# Patient Record
Sex: Female | Born: 1975
Health system: Southern US, Community
[De-identification: ages and names within clinical notes are randomized; demographics above are authoritative.]

## PROBLEM LIST (undated history)

## (undated) DIAGNOSIS — E039 Hypothyroidism, unspecified: Secondary | ICD-10-CM

## (undated) HISTORY — PX: PLACEMENT OF BREAST IMPLANTS: SHX6334

## (undated) HISTORY — DX: Hypothyroidism, unspecified: E03.9

## (undated) HISTORY — PX: OTHER SURGICAL HISTORY: SHX169

---

## 2015-02-28 ENCOUNTER — Other Ambulatory Visit: Payer: Self-pay | Admitting: Internal Medicine

## 2015-02-28 DIAGNOSIS — N63 Unspecified lump in unspecified breast: Secondary | ICD-10-CM

## 2015-03-02 ENCOUNTER — Ambulatory Visit
Admission: RE | Admit: 2015-03-02 | Discharge: 2015-03-02 | Disposition: A | Discharge status: Home / Self Care | Payer: BLUE CROSS/BLUE SHIELD | Source: Ambulatory Visit | Attending: Internal Medicine | Admitting: Internal Medicine

## 2015-03-02 DIAGNOSIS — N63 Unspecified lump in unspecified breast: Secondary | ICD-10-CM

## 2016-08-01 HISTORY — PX: AUGMENTATION MAMMAPLASTY: SUR837

## 2018-06-02 DIAGNOSIS — L509 Urticaria, unspecified: Secondary | ICD-10-CM | POA: Diagnosis not present

## 2018-06-22 DIAGNOSIS — E782 Mixed hyperlipidemia: Secondary | ICD-10-CM | POA: Diagnosis not present

## 2018-06-22 DIAGNOSIS — Z0001 Encounter for general adult medical examination with abnormal findings: Secondary | ICD-10-CM | POA: Diagnosis not present

## 2018-06-22 DIAGNOSIS — L509 Urticaria, unspecified: Secondary | ICD-10-CM | POA: Diagnosis not present

## 2018-07-15 DIAGNOSIS — E782 Mixed hyperlipidemia: Secondary | ICD-10-CM | POA: Diagnosis not present

## 2018-07-15 DIAGNOSIS — Z0001 Encounter for general adult medical examination with abnormal findings: Secondary | ICD-10-CM | POA: Diagnosis not present

## 2018-07-15 DIAGNOSIS — N939 Abnormal uterine and vaginal bleeding, unspecified: Secondary | ICD-10-CM | POA: Diagnosis not present

## 2018-07-15 DIAGNOSIS — L509 Urticaria, unspecified: Secondary | ICD-10-CM | POA: Diagnosis not present

## 2018-07-15 DIAGNOSIS — Z124 Encounter for screening for malignant neoplasm of cervix: Secondary | ICD-10-CM | POA: Diagnosis not present

## 2018-07-16 ENCOUNTER — Other Ambulatory Visit: Payer: Self-pay | Admitting: Internal Medicine

## 2018-07-16 DIAGNOSIS — Z1231 Encounter for screening mammogram for malignant neoplasm of breast: Secondary | ICD-10-CM

## 2018-07-30 ENCOUNTER — Ambulatory Visit: Payer: BLUE CROSS/BLUE SHIELD | Admitting: Women's Health

## 2018-07-30 ENCOUNTER — Encounter: Payer: Self-pay | Admitting: Women's Health

## 2018-07-30 VITALS — BP 100/70 | HR 81 | Ht 62.0 in | Wt 137.4 lb

## 2018-07-30 DIAGNOSIS — N920 Excessive and frequent menstruation with regular cycle: Secondary | ICD-10-CM | POA: Diagnosis not present

## 2018-07-30 LAB — POCT WET PREP (WET MOUNT)
CLUE CELLS WET PREP WHIFF POC: NEGATIVE
Trichomonas Wet Prep HPF POC: ABSENT

## 2018-07-30 NOTE — Progress Notes (Addendum)
   GYN VISIT Patient name: Madison Griffin MRN [id number]  Date of birth: [birth year] Chief Complaint:   new gyn (abnormal uterine bleeding / pap done 07-15-18)  History of Present Illness:   Madison Griffin is a 42 y.o. G0 Caucasian female being seen today for as a new pt for report of heavy painful periods. Periods have been heavy since menarche @ 42yo, however have worsened in last 1.6632yrs. Has 1 to 2 periods q month, last 10-15d, changes super tampon and pad q 1-2hrs at heaviest, painful cramps, clots as large as oranges, soils clothes often. Had normal pelvic u/s in 2012. Does not want children. Normal pap 07/15/18 w/ PCP.     Patient's last menstrual period was 07/26/2018.  Last pap 07/15/2018 w/ PCP. Results were:  normal w/ -HRHPV Review of Systems:   Pertinent items are noted in HPI Denies fever/chills, dizziness, headaches, visual disturbances, fatigue, shortness of breath, chest pain, abdominal pain, vomiting, abnormal vaginal discharge/itching/odor/irritation, problems with periods, bowel movements, urination, or intercourse unless otherwise stated above.  Pertinent History Reviewed:  Reviewed past medical,surgical, social, obstetrical and family history.  Reviewed problem list, medications and allergies. Physical Assessment:   Vitals:   07/30/18 1505  BP: 100/70  Pulse: 81  Weight: 137 lb 6.4 oz (62.3 kg)  Height: 5\' 2"  (1.575 m)  Body mass index is 25.13 kg/m.       Physical Examination:   General appearance: alert, well appearing, and in no distress  Mental status: alert, oriented to person, place, and time  Skin: warm & dry   Cardiovascular: normal heart rate noted  Respiratory: normal respiratory effort, no distress  Abdomen: soft, non-tender   Pelvic: VULVA: normal appearing vulva with no masses, tenderness or lesions, VAGINA: normal appearing vagina with normal color and discharge, no lesions, CERVIX: normal appearing cervix without discharge or lesions, very tender UTERUS:  uterus is normal size, shape, consistency, very tender, ADNEXA: normal adnexa in size, nontender and no masses, very tender bilaterally  Extremities: no edema   Results for orders placed or performed in visit on 07/30/18 (from the past 24 hour(s))  POCT Wet Prep Mellody Drown([place] [place])   Collection Time: 07/30/18  4:12 PM  Result Value Ref Range   Source Wet Prep POC vaginal    WBC, Wet Prep HPF POC none    Bacteria Wet Prep HPF POC Few Few   BACTERIA WET PREP MORPHOLOGY POC     Clue Cells Wet Prep HPF POC None None   Clue Cells Wet Prep Whiff POC Negative Whiff    Yeast Wet Prep HPF POC None None   KOH Wet Prep POC     Trichomonas Wet Prep HPF POC Absent Absent    Assessment & Plan:  1) Menorrhagia w/ regular cycles> get pelvic u/s, discussed birth control vs ablation- pt prefers ablation- has done birth control in past and did not like it  Meds: No orders of the defined types were placed in this encounter.   Orders Placed This Encounter  Procedures  . US PELVIS (TRANSABDOMINAL ONLY)  . US PELVIS TRANSVANGINAL NON-OB (TV ONLY)  . POCT Wet Prep [facility]([place] [place])    Return for next Friday for pelvic u/s and f/u w/ JVF after.  Cheral MarkerKimberly R Tiarra Anastacio CNM, [facility]NP-BC 07/30/2018 4:12 PM

## 2018-07-30 NOTE — Addendum Note (Signed)
Addended by: Cheral MarkerBOOKER, Vy Badley R on: 07/30/2018 04:12 PM   Modules accepted: Orders

## 2018-08-07 ENCOUNTER — Ambulatory Visit (INDEPENDENT_AMBULATORY_CARE_PROVIDER_SITE_OTHER): Payer: BLUE CROSS/BLUE SHIELD

## 2018-08-07 ENCOUNTER — Ambulatory Visit: Payer: BLUE CROSS/BLUE SHIELD | Admitting: Obstetrics and Gynecology

## 2018-08-07 DIAGNOSIS — N920 Excessive and frequent menstruation with regular cycle: Secondary | ICD-10-CM

## 2018-08-07 NOTE — Progress Notes (Signed)
PELVIC US TA/TV: anteverted uterus w/focal anterior heterogeneity 2.2 x 1.6 x 1.7 cm (? Adenomyosis),simple left ovarian cyst 2.5 x 2.2 x 1.5 cm,normal right ovary,no free fluid,no pain during ultrasound,ovaries appear mobile

## 2018-08-20 ENCOUNTER — Ambulatory Visit: Payer: BLUE CROSS/BLUE SHIELD | Admitting: Obstetrics and Gynecology

## 2018-08-20 ENCOUNTER — Ambulatory Visit (HOSPITAL_COMMUNITY)
Admission: RE | Admit: 2018-08-20 | Discharge: 2018-08-20 | Disposition: A | Discharge status: Home / Self Care | Payer: BLUE CROSS/BLUE SHIELD | Source: Ambulatory Visit | Attending: Internal Medicine | Admitting: Internal Medicine

## 2018-08-20 ENCOUNTER — Encounter (HOSPITAL_COMMUNITY): Payer: Self-pay

## 2018-08-20 ENCOUNTER — Other Ambulatory Visit: Payer: Self-pay | Admitting: Internal Medicine

## 2018-08-20 ENCOUNTER — Encounter: Payer: Self-pay | Admitting: Obstetrics and Gynecology

## 2018-08-20 ENCOUNTER — Other Ambulatory Visit (HOSPITAL_COMMUNITY): Payer: Self-pay | Admitting: Internal Medicine

## 2018-08-20 VITALS — BP 93/66 | HR 86 | Ht 62.0 in | Wt 138.0 lb

## 2018-08-20 DIAGNOSIS — N8000 Endometriosis of the uterus, unspecified: Secondary | ICD-10-CM

## 2018-08-20 DIAGNOSIS — R928 Other abnormal and inconclusive findings on diagnostic imaging of breast: Secondary | ICD-10-CM

## 2018-08-20 DIAGNOSIS — Z1231 Encounter for screening mammogram for malignant neoplasm of breast: Secondary | ICD-10-CM

## 2018-08-20 NOTE — Progress Notes (Signed)
Patient ID: Madison Griffin, female   DOB: May 11, 1976, 42 y.o.   MRN: [id number]   [facility]  @[facility] @            Patient name: Madison Laaula Shorten MRN [id number]  Date of birth: May 11, 1976  CC & HPI:  Madison Laaula Barbone is a 42 y.o. female presenting today for a F/U after her US on 08/07/2018 for AUB/menorrhagia. Her periods have always been heavy but have worsened in the past 1.8010yrs. They last 10-15 days and during the first 3-4 days she changes her pad/tampon every hour. Pt experiences severe, stabbing pains during her periods and pain towards the entrance of her vagina during intercourse. Pain is worse in the morning and nighttime. She has no children by choice and used BCPs in the past. She is not taking BCPs now, and gets cramps even when she does not have her period. She is reluctant to start taking BCPs again because they made her nauseous. Her last pap was on 07/15/2018 and was normal. The patient denies fever, chills or any other symptoms or complaints at this time.   ROS:  ROS + AUB + menorrhagia + abdominal cramping with periods - fever - chills All systems are negative except as noted in the HPI and PMH.   Pertinent History Reviewed:   Reviewed:  Medical        History reviewed. No pertinent past medical history.                            Surgical Hx:    Past Surgical History:  Procedure Laterality Date  . AUGMENTATION MAMMAPLASTY Bilateral 08/01/2016  . breast inplant    . PLACEMENT OF BREAST IMPLANTS     Medications: Reviewed & Updated - see associated section                      No current outpatient medications on file.  Social History: Reviewed -  reports that she has never smoked. She has never used smokeless tobacco.  Objective Findings:  Vitals: Height 5\' 2"  (1.575 m), weight 138 lb (62.6 kg), last menstrual period 07/26/2018.  PHYSICAL EXAMINATION General appearance - alert, well appearing, and in no distress, oriented to person, place, and time and normal  appearing weight Mental status - alert, oriented to person, place, and time, normal mood, behavior, speech, dress, motor activity, and thought processes, affect appropriate to mood  PELVIC (exam performed at 2:15pm) VAGINA: walls nontender, long, well supported. Normal secretions CERVIX: deviated slightly to the right, firm BLADDER: contact caused slight pain/pressure symptoms UTERUS: anteflexed, apparent discomfort with contract of uterine body  Assessment & Plan:   A:  1. Probable adenomyosis -- US reviewed with pt 2. Dyspareunia 3. Dysmenorrhea 4. Returned at 2:15pm today for pelvic exam  P:  1.   F/U in 2 weeks after discussing laparoscopic supracervical hysterectomy with husband  By signing my name below, I, Pietro CassisEmily Tufford, attest that this documentation has been prepared under the direction and in the presence of Tilda BurrowFerguson, Clifford Coudriet V, MD. Electronically Signed: Pietro CassisEmily Tufford, Medical Scribe. 08/20/18. 11:52 AM.  I personally performed the services described in this documentation, which was SCRIBED in my presence. The recorded information has been reviewed and considered accurate. It has been edited as necessary during review. Tilda BurrowJohn V Ka Flammer, MD

## 2018-08-21 ENCOUNTER — Telehealth: Payer: Self-pay | Admitting: *Deleted

## 2018-08-21 NOTE — Telephone Encounter (Signed)
    Normal pap with negative HPV, repeat in 3 yr    Pt notified.

## 2018-09-01 ENCOUNTER — Ambulatory Visit (HOSPITAL_COMMUNITY)
Admission: RE | Admit: 2018-09-01 | Discharge: 2018-09-01 | Disposition: A | Discharge status: Home / Self Care | Payer: BLUE CROSS/BLUE SHIELD | Source: Ambulatory Visit | Attending: Internal Medicine | Admitting: Internal Medicine

## 2018-09-01 ENCOUNTER — Ambulatory Visit (HOSPITAL_COMMUNITY): Admission: RE | Admit: 2018-09-01 | Payer: BLUE CROSS/BLUE SHIELD | Source: Ambulatory Visit

## 2018-09-01 DIAGNOSIS — R928 Other abnormal and inconclusive findings on diagnostic imaging of breast: Secondary | ICD-10-CM | POA: Diagnosis not present

## 2018-09-01 DIAGNOSIS — R922 Inconclusive mammogram: Secondary | ICD-10-CM | POA: Diagnosis not present

## 2018-09-01 NOTE — Progress Notes (Signed)
Progress Notes by Tilda Burrow, MD at 08/20/2018 12:00 PM  Author: Tilda Burrow, MD Author Type: Physician Filed: 08/23/2018 8:32 PM  Note Status: Signed Cosign: Cosign Not Required Encounter Date: 08/20/2018  Editor: Tilda Burrow, MD (Physician)  Prior Versions: 1. Jerilee Field at 08/20/2018 2:42 PM - Shared   2. Jerilee Field at 08/20/2018 12:50 PM - Shared    Patient ID: Madison Griffin, female   DOB: [birth year], 42 y.o.   MRN: [id number]   [facility] Clinic Visit  @DATE @            Patient name: Madison Griffin      MRN [id number]  Date of birth: 08/07/1976  CC & HPI:  Madison Griffin is a 42 y.o. female presenting today for a F/U after her [place] on 08/07/2018 for AUB/menorrhagia. Her periods have always been heavy but have worsened in the past 1.56yrs. They last 10-15 days and during the first 3-4 days she changes her pad/tampon every hour. Pt experiences severe, stabbing pains during her periods and pain towards the entrance of her vagina during intercourse. Pain is worse in the morning and nighttime. She has no children by choice and used BCPs in the past. She is not taking BCPs now, and gets cramps even when she does not have her period. She is reluctant to start taking BCPs again because they made her nauseous. Her last pap was on 07/15/2018 and was normal. The patient denies fever, chills or any other symptoms or complaints at this time.   ROS:  ROS + AUB + menorrhagia + abdominal cramping with periods - fever - chills All systems are negative except as noted in the HPI and PMH.   Pertinent History Reviewed:   Reviewed:  Medical        History reviewed. No pertinent past medical history.                            Surgical Hx:         Past Surgical History:  Procedure Laterality Date  . AUGMENTATION MAMMAPLASTY Bilateral 08/01/2016  . breast inplant    . PLACEMENT OF BREAST IMPLANTS     Medications: Reviewed & Updated - see associated section          No current outpatient medications on file.  Social History: Reviewed -  reports that she has never smoked. She has never used smokeless tobacco.  Objective Findings:  Vitals: Height 5\' 2"  (1.575 m), weight 138 lb (62.6 kg), last menstrual period 07/26/2018.  PHYSICAL EXAMINATION General appearance - alert, well appearing, and in no distress, oriented to person, place, and time and normal appearing weight Mental status - alert, oriented to person, place, and time, normal mood, behavior, speech, dress, motor activity, and thought processes, affect appropriate to mood  PELVIC (exam performed at 2:15pm) VAGINA: walls nontender, long, well supported. Normal secretions CERVIX: deviated slightly to the right, firm BLADDER: contact caused slight pain/pressure symptoms UTERUS: anteflexed, apparent discomfort with contract of uterine body GYNECOLOGIC SONOGRAM   Madison Griffin . LMP 07/26/2018 she is here for a pelvic sonogram for menorrhagia.  Uterus                      7.5 x 4.3 x 5.3 cm, vol 89 ml, anteverted uterus w/focal anterior heterogeneity 2.2 x 1.6 x 1.7 cm (? Adenomyosis)  Endometrium  7.1 mm, symmetrical, wnl  Right ovary             2.3 x 1.1 x 1.8 cm, wnl  Left ovary                8.9 x 2.4 x 2.3 cm, simple left ovarian cyst 2.5 x 2.2 x 1.5 cm  No free fluid   Technician Comments:  PELVIC US TA/TV: anteverted uterus w/focal anterior heterogeneity 2.2 x 1.6 x 1.7 cm (? Adenomyosis),simple left ovarian cyst 2.5 x 2.2 x 1.5 cm,normal right ovary,EEC 7.1 mm,no free fluid,no pain during ultrasound,ovaries appear mobile     E. I. du Pontmber J Carl 08/07/2018 3:03 PM  Clinical Impression and recommendations:  I have reviewed the sonogram results above.  Combined with the patient's current clinical course, below are my impressions and any appropriate recommendations for management based on the sonographic findings:  1.  Heterogenous myometrium with  fluid pockets in anterior myometrium most c/w adenomyosis. Normal ovaries, and cervix Tilda BurrowJohn V Jemmie Ledgerwood  Assessment & Plan:   A:  1. Probable adenomyosis -- US reviewed with pt 2. Dyspareunia 3. Dysmenorrhea 4. Returned at 2:15pm today for pelvic exam  P:  1.   F/U in 2 weeks after discussing laparoscopic supracervical hysterectomy with husband  By signing my name below, I, Pietro CassisEmily Tufford, attest that this documentation has been prepared under the direction and in the presence of Tilda BurrowFerguson, Raju Coppolino V, MD. Electronically Signed: Pietro CassisEmily Tufford, Medical Scribe. 08/20/18. 11:52 AM.  I personally performed the services described in this documentation, which was SCRIBED in my presence. The recorded information has been reviewed and considered accurate. It has been edited as necessary during review. Tilda BurrowJohn V Levora Werden, MD      Instructions      Return in about 2 weeks (around 09/03/2018) for GYN: FU.  Communications      [facility] Provider CC Chart Rep sent to Benita StabileJohn Z Hall, MD  Sent 08/23/2018

## 2018-09-02 ENCOUNTER — Ambulatory Visit: Payer: BLUE CROSS/BLUE SHIELD | Admitting: Obstetrics and Gynecology

## 2018-09-02 ENCOUNTER — Encounter: Payer: Self-pay | Admitting: Obstetrics and Gynecology

## 2018-09-02 VITALS — BP 122/73 | HR 80 | Ht 62.0 in | Wt 138.4 lb

## 2018-09-02 DIAGNOSIS — N941 Unspecified dyspareunia: Secondary | ICD-10-CM | POA: Diagnosis not present

## 2018-09-02 DIAGNOSIS — N8000 Endometriosis of the uterus, unspecified: Secondary | ICD-10-CM

## 2018-09-02 NOTE — Progress Notes (Signed)
Patient ID: Madison Griffin, female   DOB: 05-14-1976, 42 y.o.   MRN: [id number]    [facility] Clinic Visit  @DATE @            Patient name: Madison Griffin MRN [id number]  Date of birth: [birth year]  CC & HPI:  Madison Griffin is a 42 y.o. female presenting today for f/u visit from 08/20/18, with her husband to discuss  Treat her suspected adenomyosis. There was painful sexual intercourse and discussed laparoscopic hysterectomy leaving the ovaries  ROS:  ROS -fever -chills All systems are negative except as noted in the HPI and PMH.   Pertinent History Reviewed:   Reviewed: Medical        No past medical history on file.                            Surgical Hx:    Past Surgical History:  Procedure Laterality Date  . AUGMENTATION MAMMAPLASTY Bilateral 08/01/2016  . breast inplant    . PLACEMENT OF BREAST IMPLANTS     Medications: Reviewed & Updated - see associated section                      No current outpatient medications on file.   Social History: Reviewed -  reports that she has never smoked. She has never used smokeless tobacco.  Objective Findings:  Vitals: Blood pressure 122/73, pulse 80, height 5\' 2"  (1.575 m), weight 138 lb 6.4 oz (62.8 kg), last menstrual period 08/22/2018.  PHYSICAL EXAMINATION General appearance - alert, well appearing, and in no distress, oriented to person, place, and time and normal appearing weight Mental status - alert, oriented to person, place, and time, normal mood, behavior, speech, dress, motor activity, and thought processes, affect appropriate to mood  PELVIC NOT DONE-DISCUSSION ONLY  Assessment & Plan:   A:  1.  adenomyosis with dyspareunia    P:  1.  Mid January for laparoscopic hysterectomy 2. F/u 3 months for pre-op   Discussion: 1. Discussed with pt risks and benefits of laparoscopic hysterectomy  At end of discussion, pt had opportunity to ask questions and has no further questions at this time.   Specific discussion  of laparoscopic hysterectomy as noted above. Greater than 50% was spent in counseling and coordination of care with the patient.   Total time greater than: 15 minutes.    By signing my name below, I, Arnette Norris, attest that this documentation has been prepared under the direction and in the presence of Tilda Burrow, MD. Electronically Signed: Arnette Norris Medical Scribe. 09/02/18. 3:42 PM.  I personally performed the services described in this documentation, which was SCRIBED in my presence. The recorded information has been reviewed and considered accurate. It has been edited as necessary during review. Tilda Burrow, MD

## 2018-11-16 ENCOUNTER — Ambulatory Visit: Payer: BLUE CROSS/BLUE SHIELD | Admitting: Obstetrics and Gynecology

## 2018-11-16 ENCOUNTER — Encounter: Payer: Self-pay | Admitting: Obstetrics and Gynecology

## 2018-11-16 VITALS — BP 119/77 | HR 69 | Ht 64.0 in | Wt 137.6 lb

## 2018-11-16 DIAGNOSIS — N800 Endometriosis of uterus: Secondary | ICD-10-CM

## 2018-11-16 DIAGNOSIS — N941 Unspecified dyspareunia: Secondary | ICD-10-CM | POA: Diagnosis not present

## 2018-11-16 DIAGNOSIS — N809 Endometriosis, unspecified: Secondary | ICD-10-CM | POA: Diagnosis not present

## 2018-11-16 DIAGNOSIS — N8003 Adenomyosis of the uterus: Secondary | ICD-10-CM

## 2018-11-16 NOTE — Progress Notes (Addendum)
Patient ID: Madison Griffin, female   DOB: July 21, 1976, 42 y.o.   MRN: [id number]  Discussion: 1. Discussed with pt and husband risks and benefits of laparoscopic vaginally assisted hysterectomy.  Different types of hysterectomy with cervical preservation, laparoscopic supracervical hysterectomy discussed and patient prefers to have cervix removed.  Discussed restoration of the ovaries and patient desires ovarian preservation.  Discussed removal of fallopian tubes with its reduced lifetime cancer risk in this area of up to 50% and patient desires to have the tubes removed  Says that she feels pain as if something is being contacted during deep penetration inside and has residual pain next day. Has no children. Would like to have Surgery in late January, around January 28th. At end of discussion, pt had opportunity to ask questions and has no further questions at this time.   Specific discussion of  laparoscopic assisted vaginal hysterectomy with bilateral salpingectomy  as noted above. Greater than 50% was spent in counseling and coordination of care with the patient.  Patient has brochures about laparoscopic surgery already  Total time greater than: 20 minutes.    By signing my name below, I, Arnette NorrisMari Johnson, attest that this documentation has been prepared under the direction and in the presence of Tilda BurrowFerguson, Drew Lips V, MD. Electronically Signed: Arnette NorrisMari Johnson Medical Scribe. 11/16/18. 4:06 PM.  I personally performed the services described in this documentation, which was SCRIBED in my presence. The recorded information has been reviewed and considered accurate. It has been edited as necessary during review. Tilda BurrowJohn V Garet Hooton, MD

## 2018-12-07 ENCOUNTER — Ambulatory Visit: Payer: BLUE CROSS/BLUE SHIELD | Admitting: Obstetrics and Gynecology

## 2018-12-14 ENCOUNTER — Ambulatory Visit: Payer: BLUE CROSS/BLUE SHIELD | Admitting: Obstetrics and Gynecology

## 2018-12-14 ENCOUNTER — Encounter: Payer: Self-pay | Admitting: Obstetrics and Gynecology

## 2018-12-14 VITALS — BP 128/73 | HR 78 | Ht 62.0 in | Wt 140.6 lb

## 2018-12-14 DIAGNOSIS — Z01818 Encounter for other preprocedural examination: Secondary | ICD-10-CM | POA: Diagnosis not present

## 2018-12-14 NOTE — Progress Notes (Signed)
Patient ID: Madison Griffin, female   DOB: 08/28/1976, 43 y.o.   MRN: [id number]  Preoperative History and Physical  Madison Griffin is a 43 y.o. No obstetric history on file. here for surgical management of Adenomyosis with dyspareunia. No significant preoperative concerns.  Proposed surgery: laparoscopic assisted vaginal hysterectomy with bilateral salpingectomy   No past medical history on file. Past Surgical History:  Procedure Laterality Date  . AUGMENTATION MAMMAPLASTY Bilateral 08/01/2016  . breast inplant    . PLACEMENT OF BREAST IMPLANTS     OB History  No obstetric history on file.  Patient denies any other pertinent gynecologic issues.   No current outpatient medications on file prior to visit.   No current facility-administered medications on file prior to visit.    No Known Allergies  Social History:   reports that she has never smoked. She has never used smokeless tobacco. She reports current alcohol use. She reports that she does not use drugs.  Family History  Problem Relation Age of Onset  . Other Father        cardiac arrest  . Diabetes Mother     Review of Systems: Noncontributory  PHYSICAL EXAM: There were no vitals taken for this visit. General appearance - alert, well appearing, and in no distress Chest - clear to auscultation, no wheezes, rales or rhonchi, symmetric air entry Heart - normal rate and regular rhythm Abdomen - soft, nontender, nondistended, no masses or organomegaly Pelvic -  VAGINA: good vaginal support, pain when palpating vaginal side wall, moderate tenderness over bladder. CERVIX: not visualized due to discomfort of exam UTERUS: tenderness when examining with speculum and finger exam. Extremities - peripheral pulses normal, no pedal edema, no clubbing or cyanosis  Labs: No results found for this or any previous visit (from the past 336 hour(s)).  Imaging Studies: No results found.  Assessment: Patient Active Problem List    Diagnosis Date Noted  . Menorrhagia with regular cycle 07/30/2018    Plan: Patient will undergo surgical management with laparoscopic assisted vaginal hysterectomy with bilateral salpingectomy on 01/12/2019   F/u 2 week Post-op visit around 01/26/2019   By signing my name below, I, Arnette Norris, attest that this documentation has been prepared under the direction and in the presence of Tilda Burrow, MD. Electronically Signed: Arnette Norris Medical Scribe. 12/14/18. 2:50 PM.  I personally performed the services described in this documentation, which was SCRIBED in my presence. The recorded information has been reviewed and considered accurate. It has been edited as necessary during review. Tilda Burrow, MD

## 2018-12-16 LAB — GC/CHLAMYDIA PROBE AMP
Chlamydia trachomatis, NAA: NEGATIVE
NEISSERIA GONORRHOEAE BY PCR: NEGATIVE

## 2019-01-05 NOTE — Patient Instructions (Addendum)
Your procedure is scheduled on: 01/12/2019  Report to [facility] at   6:15  AM.  Call this number if you have problems the morning of surgery: [phone]   Remember:   Do not drink or eat food:After Midnight.  :  Take these medicines the morning of surgery with A SIP OF WATER:    Do not wear jewelry, make-up or nail polish.  Do not wear lotions, powders, or perfumes. You may wear deodorant.  Do not shave 48 hours prior to surgery. Men may shave face and neck.  Do not bring valuables to the hospital.  Contacts, dentures or bridgework may not be worn into surgery.  Leave suitcase in the car. After surgery it may be brought to your room.  For patients admitted to the hospital, checkout time is 11:00 AM the day of discharge.   Patients discharged the day of surgery will not be allowed to drive home.    Special Instructions: Shower using CHG night before surgery and shower the day of surgery use CHG.  Use special wash - you have one bottle of CHG for all showers.  You should use approximately 1/2 of the bottle for each shower.  Laparoscopically Assisted Vaginal Hysterectomy, Care After This sheet gives you information about how to care for yourself after your procedure. Your health care provider may also give you more specific instructions. If you have problems or questions, contact your health care provider. What can I expect after the procedure? After the procedure, it is common to have:  Soreness and numbness in your incision areas.  Abdominal pain. You will be given pain medicine to control it.  Vaginal bleeding and discharge. You will need to use a sanitary napkin after this procedure.  Sore throat from the breathing tube that was inserted during surgery. Follow these instructions at home: Medicines  Take over-the-counter and prescription medicines only as told by your health care provider.  Do not take aspirin or ibuprofen. These medicines can cause bleeding.  Do not  drive or use heavy machinery while taking prescription pain medicine.  Do not drive for 24 hours if you were given a medicine to help you relax (sedative) during the procedure. Incision care   Follow instructions from your health care provider about how to take care of your incisions. Make sure you: ? Wash your hands with soap and water before you change your bandage (dressing). If soap and water are not available, use hand sanitizer. ? Change your dressing as told by your health care provider. ? Leave stitches (sutures), skin glue, or adhesive strips in place. These skin closures may need to stay in place for 2 weeks or longer. If adhesive strip edges start to loosen and curl up, you may trim the loose edges. Do not remove adhesive strips completely unless your health care provider tells you to do that.  Check your incision area every day for signs of infection. Check for: ? Redness, swelling, or pain. ? Fluid or blood. ? Warmth. ? Pus or a bad smell. Activity  Get regular exercise as told by your health care provider. You may be told to take short walks every day and go farther each time.  Return to your normal activities as told by your health care provider. Ask your health care provider what activities are safe for you.  Do not douche, use tampons, or have sexual intercourse for at least 6 weeks, or until your health care provider gives you permission.  Do  not lift anything that is heavier than 10 lb (4.5 kg), or the limit that your health care provider tells you, until he or she says that it is safe. General instructions  Do not take baths, swim, or use a hot tub until your health care provider approves. Take showers instead of baths.  Do not drive for 24 hours if you received a sedative.  Do not drive or operate heavy machinery while taking prescription pain medicine.  To prevent or treat constipation while you are taking prescription pain medicine, your health care provider  may recommend that you: ? Drink enough fluid to keep your urine clear or pale yellow. ? Take over-the-counter or prescription medicines. ? Eat foods that are high in fiber, such as fresh fruits and vegetables, whole grains, and beans. ? Limit foods that are high in fat and processed sugars, such as fried and sweet foods.  Keep all follow-up visits as told by your health care provider. This is important. Contact a health care provider if:  You have signs of infection, such as: ? Redness, swelling, or pain around your incision sites. ? Fluid or blood coming from an incision. ? An incision that feels warm to the touch. ? Pus or a bad smell coming from an incision.  Your incision breaks open.  Your pain medicine is not helping.  You feel dizzy or light-headed.  You have pain or bleeding when you urinate.  You have persistent nausea and vomiting.  You have blood, pus, or a bad-smelling discharge from your vagina. Get help right away if:  You have a fever.  You have severe abdominal pain.  You have chest pain.  You have shortness of breath.  You faint.  You have pain, swelling, or redness in your leg.  You have heavy bleeding from your vagina. Summary  After the procedure, it is common to have abdominal pain and vaginal bleeding.  You should not drive or lift heavy objects until your health care provider says that it is safe.  Contact your health care provider if you have any symptoms of infection, excessive vaginal bleeding, nausea, vomiting, or shortness of breath. This information is not intended to replace advice given to you by your health care provider. Make sure you discuss any questions you have with your health care provider. Document Released: 11/14/2011 Document Revised: 01/21/2017 Document Reviewed: 01/21/2017 Elsevier Interactive Patient Education  2019 Elsevier Inc.  General Anesthesia, Adult, Care After This sheet gives you information about how to care  for yourself after your procedure. Your health care provider may also give you more specific instructions. If you have problems or questions, contact your health care provider. What can I expect after the procedure? After the procedure, the following side effects are common:  Pain or discomfort at the IV site.  Nausea.  Vomiting.  Sore throat.  Trouble concentrating.  Feeling cold or chills.  Weak or tired.  Sleepiness and fatigue.  Soreness and body aches. These side effects can affect parts of the body that were not involved in surgery. Follow these instructions at home:  For at least 24 hours after the procedure:  Have a responsible adult stay with you. It is important to have someone help care for you until you are awake and alert.  Rest as needed.  Do not: ? Participate in activities in which you could fall or become injured. ? Drive. ? Use heavy machinery. ? Drink alcohol. ? Take sleeping pills or medicines that cause drowsiness. ?  Make important decisions or sign legal documents. ? Take care of children on your own. Eating and drinking  Follow any instructions from your health care provider about eating or drinking restrictions.  When you feel hungry, start by eating small amounts of foods that are soft and easy to digest (bland), such as toast. Gradually return to your regular diet.  Drink enough fluid to keep your urine pale yellow.  If you vomit, rehydrate by drinking water, juice, or clear broth. General instructions  If you have sleep apnea, surgery and certain medicines can increase your risk for breathing problems. Follow instructions from your health care provider about wearing your sleep device: ? Anytime you are sleeping, including during daytime naps. ? While taking prescription pain medicines, sleeping medicines, or medicines that make you drowsy.  Return to your normal activities as told by your health care provider. Ask your health care  provider what activities are safe for you.  Take over-the-counter and prescription medicines only as told by your health care provider.  If you smoke, do not smoke without supervision.  Keep all follow-up visits as told by your health care provider. This is important. Contact a health care provider if:  You have nausea or vomiting that does not get better with medicine.  You cannot eat or drink without vomiting.  You have pain that does not get better with medicine.  You are unable to pass urine.  You develop a skin rash.  You have a fever.  You have redness around your IV site that gets worse. Get help right away if:  You have difficulty breathing.  You have chest pain.  You have blood in your urine or stool, or you vomit blood. Summary  After the procedure, it is common to have a sore throat or nausea. It is also common to feel tired.  Have a responsible adult stay with you for the first 24 hours after general anesthesia. It is important to have someone help care for you until you are awake and alert.  When you feel hungry, start by eating small amounts of foods that are soft and easy to digest (bland), such as toast. Gradually return to your regular diet.  Drink enough fluid to keep your urine pale yellow.  Return to your normal activities as told by your health care provider. Ask your health care provider what activities are safe for you. This information is not intended to replace advice given to you by your health care provider. Make sure you discuss any questions you have with your health care provider. Document Released: 03/03/2001 Document Revised: 07/11/2017 Document Reviewed: 07/11/2017 Elsevier Interactive Patient Education  [employer]2019 [employer]   Your procedure is scheduled on:   [facility].  Call this number if you have problems the morning of surgery: [phone]   Remember:   Do not drink or eat food:After Midnight.  :  Take  these medicines the morning of surgery with A SIP OF WATER:    Do not wear jewelry, make-up or nail polish.  Do not wear lotions, powders, or perfumes. You may wear deodorant.  Do not shave 48 hours prior to surgery. Men may shave face and neck.  Do not bring valuables to the hospital.  Contacts, dentures or bridgework may not be worn into surgery.  Leave suitcase in the car. After surgery it may be brought to your room.  For patients admitted to the hospital, checkout time is  11:00 AM the day of discharge.   Patients discharged the day of surgery will not be allowed to drive home.    Special Instructions: Shower using CHG night before surgery and shower the day of surgery use CHG.  Use special wash - you have one bottle of CHG for all showers.  You should use approximately 1/2 of the bottle for each shower.

## 2019-01-05 NOTE — Patient Instructions (Signed)
   Your procedure is scheduled on: 01/12/2019  Report to [facility] at  6:15   AM.  Call this number if you have problems the morning of surgery: [phone]   Remember:   Do not drink or eat food:After Midnight.  :  Take these medicines the morning of surgery with A SIP OF WATER:    Do not wear jewelry, make-up or nail polish.  Do not wear lotions, powders, or perfumes. You may wear deodorant.  Do not shave 48 hours prior to surgery. Men may shave face and neck.  Do not bring valuables to the hospital.  Contacts, dentures or bridgework may not be worn into surgery.  Leave suitcase in the car. After surgery it may be brought to your room.  For patients admitted to the hospital, checkout time is 11:00 AM the day of discharge.   Patients discharged the day of surgery will not be allowed to drive home.    Special Instructions: Shower using CHG night before surgery and shower the day of surgery use CHG.  Use special wash - you have one bottle of CHG for all showers.  You should use approximately 1/2 of the bottle for each shower.

## 2019-01-07 ENCOUNTER — Encounter (HOSPITAL_COMMUNITY): Payer: Self-pay

## 2019-01-07 ENCOUNTER — Encounter (HOSPITAL_COMMUNITY)
Admission: RE | Admit: 2019-01-07 | Discharge: 2019-01-07 | Disposition: A | Discharge status: Home / Self Care | Payer: BLUE CROSS/BLUE SHIELD | Source: Ambulatory Visit | Attending: Obstetrics and Gynecology | Admitting: Obstetrics and Gynecology

## 2019-01-07 ENCOUNTER — Other Ambulatory Visit: Payer: Self-pay

## 2019-01-07 DIAGNOSIS — N941 Unspecified dyspareunia: Secondary | ICD-10-CM | POA: Insufficient documentation

## 2019-01-07 DIAGNOSIS — Z01812 Encounter for preprocedural laboratory examination: Secondary | ICD-10-CM | POA: Insufficient documentation

## 2019-01-07 DIAGNOSIS — N854 Malposition of uterus: Secondary | ICD-10-CM | POA: Diagnosis not present

## 2019-01-07 LAB — COMPREHENSIVE METABOLIC PANEL
ALT: 19 U/L (ref 0–44)
AST: 21 U/L (ref 15–41)
Albumin: 4.2 g/dL (ref 3.5–5.0)
Alkaline Phosphatase: 50 U/L (ref 38–126)
Anion gap: 8 (ref 5–15)
BUN: 8 mg/dL (ref 6–20)
CHLORIDE: 104 mmol/L (ref 98–111)
CO2: 25 mmol/L (ref 22–32)
Calcium: 8.9 mg/dL (ref 8.9–10.3)
Creatinine, Ser: 0.7 mg/dL (ref 0.44–1.00)
GFR calc Af Amer: >60 mL/min (ref >60)
Glucose, Bld: 96 mg/dL (ref 70–99)
Potassium: 4.1 mmol/L (ref 3.5–5.1)
Sodium: 137 mmol/L (ref 135–145)
Total Bilirubin: 0.7 mg/dL (ref 0.3–1.2)
Total Protein: 7.6 g/dL (ref 6.5–8.1)

## 2019-01-07 LAB — TYPE AND SCREEN
ABO/RH(D): A POS
Antibody Screen: NEGATIVE

## 2019-01-07 LAB — URINALYSIS, ROUTINE W REFLEX MICROSCOPIC
Bilirubin Urine: NEGATIVE
GLUCOSE, UA: NEGATIVE mg/dL
Ketones, ur: NEGATIVE mg/dL
Nitrite: NEGATIVE
PROTEIN: NEGATIVE mg/dL
Specific Gravity, Urine: 1.009 (ref 1.005–1.030)
pH: 7 (ref 5.0–8.0)

## 2019-01-07 LAB — CBC
HCT: 36.1 % (ref 36.0–46.0)
Hemoglobin: 11.6 g/dL — ABNORMAL LOW (ref 12.0–15.0)
MCH: 29.5 pg (ref 26.0–34.0)
MCHC: 32.1 g/dL (ref 30.0–36.0)
MCV: 91.9 fL (ref 80.0–100.0)
PLATELETS: 313 10*3/uL (ref 150–400)
RBC: 3.93 MIL/uL (ref 3.87–5.11)
RDW: 13.3 % (ref 11.5–15.5)
WBC: 8.4 10*3/uL (ref 4.0–10.5)
nRBC: 0 % (ref 0.0–0.2)

## 2019-01-07 LAB — HCG, SERUM, QUALITATIVE: Preg, Serum: NEGATIVE

## 2019-01-08 ENCOUNTER — Encounter: Payer: Self-pay | Admitting: Obstetrics and Gynecology

## 2019-01-08 ENCOUNTER — Telehealth: Payer: Self-pay | Admitting: Obstetrics and Gynecology

## 2019-01-08 ENCOUNTER — Ambulatory Visit: Payer: BLUE CROSS/BLUE SHIELD | Admitting: Obstetrics and Gynecology

## 2019-01-08 VITALS — BP 111/68 | HR 76 | Ht 62.0 in | Wt 139.6 lb

## 2019-01-08 DIAGNOSIS — N800 Endometriosis of uterus: Secondary | ICD-10-CM

## 2019-01-08 DIAGNOSIS — N8003 Adenomyosis of the uterus: Secondary | ICD-10-CM

## 2019-01-08 DIAGNOSIS — N809 Endometriosis, unspecified: Secondary | ICD-10-CM

## 2019-01-08 DIAGNOSIS — Z01818 Encounter for other preprocedural examination: Secondary | ICD-10-CM | POA: Diagnosis not present

## 2019-01-08 NOTE — Progress Notes (Signed)
Patient ID: Madison Griffin, female   DOB: 21-Jan-1976, 43 y.o.   MRN: [id number]  I have evaluated [name] preoperatively, and have identified no interval change in the medical condition or plan of care since the history and physical of record. LMP 12/23/2018  Pelvic exam:  VAGINA: moderate to extreme discomfort with standard grade speculum, good bladder support vaginal sidewall discomfort upper limits normal CERVIX: normal appearing cervix without discharge or lesions, adequate uterine mobility from its anterior position that I think LAVH should be reasonable. UTERUS: anterior  A: 1. Adenomyosis  P: 1. LAVH 01/12/2019 with risks potential complications and alternatives reviewed with patient and partner  By signing my name below, I, Arnette Norris, attest that this documentation has been prepared under the direction and in the presence of Tilda Burrow, MD. Electronically Signed: Arnette Norris Medical Scribe. 01/08/19. 1:08 PM.  I personally performed the services described in this documentation, which was SCRIBED in my presence. The recorded information has been reviewed and considered accurate. It has been edited as necessary during review. Tilda Burrow, MD

## 2019-01-08 NOTE — Telephone Encounter (Signed)
Pt informed that surgery time moved to 10 am, due to other surgery that morning at [facility]. Pt to come in today at 12:30 for final pre OP exam. Labs Reviewed with the patient.

## 2019-01-12 ENCOUNTER — Other Ambulatory Visit: Payer: Self-pay

## 2019-01-12 ENCOUNTER — Observation Stay (HOSPITAL_COMMUNITY): Payer: BLUE CROSS/BLUE SHIELD | Admitting: Anesthesiology

## 2019-01-12 ENCOUNTER — Encounter (HOSPITAL_COMMUNITY)
Admission: RE | Disposition: A | Discharge status: Home / Self Care | Payer: Self-pay | Source: Home / Self Care | Attending: Obstetrics and Gynecology

## 2019-01-12 ENCOUNTER — Encounter (HOSPITAL_COMMUNITY): Payer: Self-pay | Admitting: *Deleted

## 2019-01-12 ENCOUNTER — Observation Stay (HOSPITAL_COMMUNITY)
Admission: RE | Admit: 2019-01-12 | Discharge: 2019-01-13 | Disposition: A | Discharge status: Home / Self Care | Payer: BLUE CROSS/BLUE SHIELD | Attending: Obstetrics and Gynecology | Admitting: Obstetrics and Gynecology

## 2019-01-12 DIAGNOSIS — N920 Excessive and frequent menstruation with regular cycle: Secondary | ICD-10-CM | POA: Diagnosis not present

## 2019-01-12 DIAGNOSIS — N800 Endometriosis of uterus: Principal | ICD-10-CM | POA: Insufficient documentation

## 2019-01-12 DIAGNOSIS — N888 Other specified noninflammatory disorders of cervix uteri: Secondary | ICD-10-CM | POA: Insufficient documentation

## 2019-01-12 DIAGNOSIS — N941 Unspecified dyspareunia: Secondary | ICD-10-CM | POA: Insufficient documentation

## 2019-01-12 DIAGNOSIS — N809 Endometriosis, unspecified: Secondary | ICD-10-CM | POA: Diagnosis not present

## 2019-01-12 DIAGNOSIS — Z90710 Acquired absence of both cervix and uterus: Secondary | ICD-10-CM | POA: Diagnosis present

## 2019-01-12 HISTORY — PX: LAPAROSCOPIC ASSISTED VAGINAL HYSTERECTOMY: SHX5398

## 2019-01-12 LAB — CREATININE, SERUM
Creatinine, Ser: 0.7 mg/dL (ref 0.44–1.00)
GFR calc non Af Amer: >60 mL/min (ref >60)

## 2019-01-12 LAB — CBC
HCT: 36.6 % (ref 36.0–46.0)
HEMOGLOBIN: 11.9 g/dL — AB (ref 12.0–15.0)
MCH: 30.4 pg (ref 26.0–34.0)
MCHC: 32.5 g/dL (ref 30.0–36.0)
MCV: 93.4 fL (ref 80.0–100.0)
Platelets: 295 10*3/uL (ref 150–400)
RBC: 3.92 MIL/uL (ref 3.87–5.11)
RDW: 13.2 % (ref 11.5–15.5)
WBC: 16.8 10*3/uL — ABNORMAL HIGH (ref 4.0–10.5)
nRBC: 0 % (ref 0.0–0.2)

## 2019-01-12 SURGERY — HYSTERECTOMY, VAGINAL, LAPAROSCOPY-ASSISTED
Anesthesia: General

## 2019-01-12 MED ORDER — HYDROMORPHONE HCL 1 MG/ML IJ SOLN
0.2500 mg | INTRAMUSCULAR | Status: DC | PRN
  Administered 2019-01-12: 0.5 mg via INTRAVENOUS
  Filled 2019-01-12: qty 0.5

## 2019-01-12 MED ORDER — FENTANYL CITRATE (PF) 100 MCG/2ML IJ SOLN
INTRAMUSCULAR | Status: AC
  Filled 2019-01-12: qty 4

## 2019-01-12 MED ORDER — LACTATED RINGERS IV SOLN
INTRAVENOUS | Status: DC | PRN
  Administered 2019-01-12 (×2): via INTRAVENOUS

## 2019-01-12 MED ORDER — ROCURONIUM BROMIDE 10 MG/ML (PF) SYRINGE
PREFILLED_SYRINGE | INTRAVENOUS | Status: AC
  Filled 2019-01-12: qty 10

## 2019-01-12 MED ORDER — BUPIVACAINE-EPINEPHRINE (PF) 0.25% -1:200000 IJ SOLN
INTRAMUSCULAR | Status: DC | PRN
  Administered 2019-01-12: 10 mL via PERINEURAL
  Administered 2019-01-12: 8 mL via PERINEURAL
  Administered 2019-01-12: 6 mL via PERINEURAL

## 2019-01-12 MED ORDER — MEPERIDINE HCL 50 MG/ML IJ SOLN: 6.2500 mg | INTRAMUSCULAR | Status: DC | PRN

## 2019-01-12 MED ORDER — DIPHENHYDRAMINE HCL 12.5 MG/5ML PO ELIX: 12.5000 mg | ORAL_SOLUTION | Freq: Four times a day (QID) | ORAL | Status: DC | PRN

## 2019-01-12 MED ORDER — DEXAMETHASONE SODIUM PHOSPHATE 10 MG/ML IJ SOLN
INTRAMUSCULAR | Status: AC
  Filled 2019-01-12: qty 1

## 2019-01-12 MED ORDER — WATER FOR IRRIGATION, STERILE IR SOLN
Status: DC | PRN
  Administered 2019-01-12: 1000 mL

## 2019-01-12 MED ORDER — BUPIVACAINE-EPINEPHRINE (PF) 0.25% -1:200000 IJ SOLN
INTRAMUSCULAR | Status: AC
  Filled 2019-01-12: qty 30

## 2019-01-12 MED ORDER — SUGAMMADEX SODIUM 200 MG/2ML IV SOLN
INTRAVENOUS | Status: DC | PRN
  Administered 2019-01-12 (×2): 100 mg via INTRAVENOUS

## 2019-01-12 MED ORDER — 0.9 % SODIUM CHLORIDE (POUR BTL) OPTIME
TOPICAL | Status: DC | PRN
  Administered 2019-01-12: 1000 mL

## 2019-01-12 MED ORDER — SUGAMMADEX SODIUM 200 MG/2ML IV SOLN
INTRAVENOUS | Status: AC
  Filled 2019-01-12: qty 2

## 2019-01-12 MED ORDER — ONDANSETRON HCL 4 MG/2ML IJ SOLN: 4.0000 mg | Freq: Once | INTRAMUSCULAR | Status: DC | PRN

## 2019-01-12 MED ORDER — SODIUM CHLORIDE 0.9 % IR SOLN
Status: DC | PRN
  Administered 2019-01-12 (×2): 3000 mL

## 2019-01-12 MED ORDER — ROCURONIUM BROMIDE 100 MG/10ML IV SOLN
INTRAVENOUS | Status: DC | PRN
  Administered 2019-01-12 (×3): 10 mg via INTRAVENOUS
  Administered 2019-01-12: 40 mg via INTRAVENOUS
  Administered 2019-01-12: 10 mg via INTRAVENOUS

## 2019-01-12 MED ORDER — HYDROCODONE-ACETAMINOPHEN 7.5-325 MG PO TABS: 1.0000 | ORAL_TABLET | Freq: Once | ORAL | Status: DC | PRN

## 2019-01-12 MED ORDER — DIPHENHYDRAMINE HCL 50 MG/ML IJ SOLN: 12.5000 mg | Freq: Four times a day (QID) | INTRAMUSCULAR | Status: DC | PRN

## 2019-01-12 MED ORDER — KETOROLAC TROMETHAMINE 30 MG/ML IJ SOLN
30.0000 mg | Freq: Once | INTRAMUSCULAR | Status: AC | PRN
  Administered 2019-01-12: 30 mg via INTRAVENOUS
  Filled 2019-01-12: qty 1

## 2019-01-12 MED ORDER — NALOXONE HCL 0.4 MG/ML IJ SOLN: 0.4000 mg | INTRAMUSCULAR | Status: DC | PRN

## 2019-01-12 MED ORDER — ENOXAPARIN SODIUM 40 MG/0.4ML ~~LOC~~ SOLN
40.0000 mg | SUBCUTANEOUS | Status: DC
  Administered 2019-01-13: 40 mg via SUBCUTANEOUS
  Filled 2019-01-12: qty 0.4

## 2019-01-12 MED ORDER — ONDANSETRON HCL 4 MG/2ML IJ SOLN
4.0000 mg | Freq: Four times a day (QID) | INTRAMUSCULAR | Status: DC | PRN
  Administered 2019-01-13: 4 mg via INTRAVENOUS

## 2019-01-12 MED ORDER — LACTATED RINGERS IV SOLN: INTRAVENOUS | Status: DC

## 2019-01-12 MED ORDER — LIDOCAINE 2% (20 MG/ML) 5 ML SYRINGE
INTRAMUSCULAR | Status: AC
  Filled 2019-01-12: qty 5

## 2019-01-12 MED ORDER — LIDOCAINE HCL 1 % IJ SOLN
INTRAMUSCULAR | Status: DC | PRN
  Administered 2019-01-12: 50 mg via INTRADERMAL

## 2019-01-12 MED ORDER — FENTANYL CITRATE (PF) 100 MCG/2ML IJ SOLN
INTRAMUSCULAR | Status: AC
  Filled 2019-01-12: qty 2

## 2019-01-12 MED ORDER — ONDANSETRON HCL 4 MG/2ML IJ SOLN
4.0000 mg | Freq: Four times a day (QID) | INTRAMUSCULAR | Status: DC | PRN
  Filled 2019-01-12: qty 2

## 2019-01-12 MED ORDER — MIDAZOLAM HCL 2 MG/2ML IJ SOLN
INTRAMUSCULAR | Status: AC
  Filled 2019-01-12: qty 2

## 2019-01-12 MED ORDER — CEFAZOLIN SODIUM-DEXTROSE 2-4 GM/100ML-% IV SOLN
INTRAVENOUS | Status: AC
  Filled 2019-01-12: qty 100

## 2019-01-12 MED ORDER — PROPOFOL 10 MG/ML IV BOLUS
INTRAVENOUS | Status: DC | PRN
  Administered 2019-01-12: 180 mg via INTRAVENOUS
  Administered 2019-01-12: 20 mg via INTRAVENOUS

## 2019-01-12 MED ORDER — FENTANYL CITRATE (PF) 100 MCG/2ML IJ SOLN
INTRAMUSCULAR | Status: DC | PRN
  Administered 2019-01-12 (×2): 50 ug via INTRAVENOUS
  Administered 2019-01-12: 25 ug via INTRAVENOUS
  Administered 2019-01-12: 50 ug via INTRAVENOUS
  Administered 2019-01-12: 25 ug via INTRAVENOUS

## 2019-01-12 MED ORDER — CEFAZOLIN SODIUM-DEXTROSE 2-4 GM/100ML-% IV SOLN
2.0000 g | INTRAVENOUS | Status: AC
  Administered 2019-01-12: 2 g via INTRAVENOUS

## 2019-01-12 MED ORDER — SODIUM CHLORIDE 0.9% FLUSH: 9.0000 mL | INTRAVENOUS | Status: DC | PRN

## 2019-01-12 MED ORDER — MIDAZOLAM HCL 5 MG/5ML IJ SOLN
INTRAMUSCULAR | Status: DC | PRN
  Administered 2019-01-12: 2 mg via INTRAVENOUS

## 2019-01-12 MED ORDER — HYDROMORPHONE 1 MG/ML IV SOLN
INTRAVENOUS | Status: DC
  Administered 2019-01-12: 1 mg via INTRAVENOUS
  Administered 2019-01-12 – 2019-01-13 (×3): 0.6 mg via INTRAVENOUS
  Administered 2019-01-13: 0.3 mg via INTRAVENOUS
  Filled 2019-01-12: qty 25

## 2019-01-12 MED ORDER — ONDANSETRON HCL 4 MG PO TABS: 4.0000 mg | ORAL_TABLET | Freq: Four times a day (QID) | ORAL | Status: DC | PRN

## 2019-01-12 MED ORDER — PANTOPRAZOLE SODIUM 40 MG PO TBEC
40.0000 mg | DELAYED_RELEASE_TABLET | Freq: Every day | ORAL | Status: DC
  Administered 2019-01-13: 40 mg via ORAL
  Filled 2019-01-12: qty 1

## 2019-01-12 MED ORDER — SODIUM CHLORIDE 0.9 % IV SOLN
INTRAVENOUS | Status: DC
  Administered 2019-01-12 – 2019-01-13 (×2): via INTRAVENOUS

## 2019-01-12 MED ORDER — DEXAMETHASONE SODIUM PHOSPHATE 10 MG/ML IJ SOLN
INTRAMUSCULAR | Status: DC | PRN
  Administered 2019-01-12: 5 mg via INTRAVENOUS

## 2019-01-12 SURGICAL SUPPLY — 64 items
APPLIER CLIP 5 13 M/L LIGAMAX5 (MISCELLANEOUS)
BANDAGE STRIP 1X3 FLEXIBLE (GAUZE/BANDAGES/DRESSINGS) ×3 IMPLANT
BENZOIN TINCTURE PRP APPL 2/3 (GAUZE/BANDAGES/DRESSINGS) ×3 IMPLANT
BLADE SURG SZ11 CARB STEEL (BLADE) ×3 IMPLANT
CATH ROBINSON RED A/P 16FR (CATHETERS) ×3 IMPLANT
CLIP APPLIE 5 13 M/L LIGAMAX5 (MISCELLANEOUS) IMPLANT
CLOSURE STERI-STRIP 1/4X4 (GAUZE/BANDAGES/DRESSINGS) ×3 IMPLANT
CLOTH BEACON ORANGE TIMEOUT ST (SAFETY) ×3 IMPLANT
COVER LIGHT HANDLE STERIS (MISCELLANEOUS) ×6 IMPLANT
COVER WAND RF STERILE (DRAPES) ×6 IMPLANT
DECANTER SPIKE VIAL GLASS SM (MISCELLANEOUS) ×3 IMPLANT
DRAPE PROXIMA HALF (DRAPES) ×6 IMPLANT
DRAPE STERI URO 9X17 APER PCH (DRAPES) ×3 IMPLANT
DURAPREP 26ML APPLICATOR (WOUND CARE) ×3 IMPLANT
ELECT REM PT RETURN 9FT ADLT (ELECTROSURGICAL) ×3
ELECTRODE REM PT RTRN 9FT ADLT (ELECTROSURGICAL) ×2 IMPLANT
FILTER SMOKE EVAC LAPAROSHD (FILTER) ×3 IMPLANT
GAUZE 4X4 16PLY RFD (DISPOSABLE) ×3 IMPLANT
GAUZE PACKING 2X5 YD STRL (GAUZE/BANDAGES/DRESSINGS) IMPLANT
GLOVE BIOGEL PI IND STRL 7.0 (GLOVE) ×10 IMPLANT
GLOVE BIOGEL PI IND STRL 9 (GLOVE) ×4 IMPLANT
GLOVE BIOGEL PI INDICATOR 7.0 (GLOVE) ×5
GLOVE BIOGEL PI INDICATOR 9 (GLOVE) ×2
GLOVE ECLIPSE 6.5 STRL STRAW (GLOVE) ×9 IMPLANT
GLOVE ECLIPSE 9.0 STRL (GLOVE) ×9 IMPLANT
GOWN SPEC L3 XXLG W/TWL (GOWN DISPOSABLE) ×6 IMPLANT
GOWN STRL REUS W/TWL LRG LVL3 (GOWN DISPOSABLE) ×9 IMPLANT
INST SET LAPROSCOPIC GYN AP (KITS) ×3 IMPLANT
IV NS IRRIG 3000ML ARTHROMATIC (IV SOLUTION) ×6 IMPLANT
KIT BLADEGUARD II DBL (SET/KITS/TRAYS/PACK) ×3 IMPLANT
KIT TURNOVER CYSTO (KITS) ×3 IMPLANT
KIT TURNOVER KIT A (KITS) ×3 IMPLANT
MANIFOLD NEPTUNE II (INSTRUMENTS) ×3 IMPLANT
NEEDLE HYPO 25X1 1.5 SAFETY (NEEDLE) ×3 IMPLANT
NEEDLE INSUFFLATION 120MM (ENDOMECHANICALS) ×3 IMPLANT
NS IRRIG 1000ML POUR BTL (IV SOLUTION) ×3 IMPLANT
PACK BASIC III (CUSTOM PROCEDURE TRAY) ×1
PACK PERI GYN (CUSTOM PROCEDURE TRAY) ×3 IMPLANT
PACK SRG BSC III STRL LF ECLPS (CUSTOM PROCEDURE TRAY) ×2 IMPLANT
PAD ARMBOARD 7.5X6 YLW CONV (MISCELLANEOUS) ×3 IMPLANT
SET BASIN LINEN APH (SET/KITS/TRAYS/PACK) ×3 IMPLANT
SET TUBE IRRIG SUCTION NO TIP (IRRIGATION / IRRIGATOR) ×3 IMPLANT
SHEARS HARMONIC ACE PLUS 36CM (ENDOMECHANICALS) ×3 IMPLANT
SLEEVE ENDOPATH XCEL 5M (ENDOMECHANICALS) ×6 IMPLANT
SOLUTION ANTI FOG 6CC (MISCELLANEOUS) ×3 IMPLANT
STRIP CLOSURE SKIN 1/4X3 (GAUZE/BANDAGES/DRESSINGS) ×3 IMPLANT
SUT CHROMIC 0 CT 1 (SUTURE) ×30 IMPLANT
SUT CHROMIC 2 0 CT 1 (SUTURE) ×6 IMPLANT
SUT PROLENE 0 CT 1 30 (SUTURE) IMPLANT
SUT PROLENE 2 0 FS (SUTURE) IMPLANT
SUT VIC AB 2-0 CT1 27 (SUTURE)
SUT VIC AB 2-0 CT1 TAPERPNT 27 (SUTURE) IMPLANT
SUT VIC AB 4-0 PS2 27 (SUTURE) ×3 IMPLANT
SUT VICRYL 0 UR6 27IN ABS (SUTURE) ×3 IMPLANT
SYR 10ML LL (SYRINGE) ×3 IMPLANT
SYR 30ML LL (SYRINGE) ×3 IMPLANT
SYR CONTROL 10ML LL (SYRINGE) ×6 IMPLANT
TRAY FOLEY MTR SLVR 16FR STAT (SET/KITS/TRAYS/PACK) ×3 IMPLANT
TROCAR ENDO BLADELESS 11MM (ENDOMECHANICALS) ×3 IMPLANT
TROCAR XCEL NON-BLD 5MMX100MML (ENDOMECHANICALS) ×3 IMPLANT
TUBING INSUFFLATION (TUBING) ×3 IMPLANT
VERSALIGHT (MISCELLANEOUS) ×3 IMPLANT
WARMER LAPAROSCOPE (MISCELLANEOUS) ×3 IMPLANT
WATER STERILE IRR 1000ML POUR (IV SOLUTION) ×3 IMPLANT

## 2019-01-12 NOTE — Op Note (Signed)
01/12/2019  1:03 PM  PATIENT:  Madison Griffin  43 y.o. female  PRE-OPERATIVE DIAGNOSIS:  Suspected Adenomyosis, Dyspareunia  POST-OPERATIVE DIAGNOSIS:  Suspected Adenomyosis Dyspareunia  PROCEDURE:  Procedure(s): LAPAROSCOPIC ASSISTED VAGINAL HYSTERECTOMY, bilateral salpingectomy (N/A)  SURGEON:  Surgeon(s) and Role:    Tilda Burrow* Redell Nazir V, MD - Primary  PHYSICIAN ASSISTANT:   ASSISTANTS: Valetta Closeebbie Dallas, RNFA, Laurena BeringAngela Witt, CST  ANESTHESIA:   local and general  EBL:  150 mL   BLOOD ADMINISTERED:none  DRAINS: Urinary Catheter (Foley), no vaginal packing  LOCAL MEDICATIONS USED:  MARCAINE    and Amount: 10 sees abdominal wall, another 10 cc paracervical  SPECIMEN:  Source of Specimen:  Uterus cervix bilateral fallopian tubes  DISPOSITION OF SPECIMEN:  PATHOLOGY  COUNTS:  YES  TOURNIQUET:  * No tourniquets in log *  DICTATION: .Dragon Dictation  PLAN OF CARE: Admit for overnight observation  PATIENT DISPOSITION:  PACU - hemodynamically stable.   Delay start of Pharmacological VTE agent (>24hrs) due to surgical blood loss or risk of bleeding: not applicable  Details of procedure: Patient was taken operating room prepped and draped for vaginal procedure procedure, with timeout conducted, antibiotics administered and procedure confirmed by surgical team. I placed the Foley catheter, grasped the cervix with single-tooth tenaculum and placed a Hulka uterine manipulator.  Gloves were changed. The abdomen portion of the procedure was begun with an infraumbilical vertical 1 cm skin incision, hemostat element of a down through the subcu fatty tissue, Veress needle introduced being careful to orient toward the pelvis while elevating the abdominal cavity wall, and Veress needle intraperitoneal pressure 7 mm and water droplet technique confirming free flow into the abdominal cavity without aspiration.  Pneumoperitoneum was easily achieved to 2.6 L CO2 and the 10 mm laparoscopic trocar  and camera were inserted under direct visualization without difficulty.  Pelvis was inspected.  Photo was taken to document the pelvis since the right lower quadrant and left lower quadrant trochars were then placed under direct laparoscopic visualization without difficulty.  Attention was directed the pelvis.  With the patient in Trendelenburg position the uterus could be identified as being adequately mobile without evidence of external adhesions.  Fallopian tubes were normal.  There was some small paratubal cysts on the surface of the left ovary considered benign in appearance.  There were no pelvic adhesions.  Photodocumentation performed.  Attention was first directed to the fallopian tubes.  Salpingectomy was performed on each side beginning on the right, elevating the fallopian tube with heart with a laparoscopic grasper and using the harmonic a 7 to coagulate and amputate the fallopian tube off of the mesosalpinx staying close to the fallopian tube.  Specimens were extracted through the laparoscopic lower abdominal ports.  Attention was then directed to the right adnexa.  The right round ligament was then grasped with harmonic a 7, coagulated and transected.  The ovarian ligament and the mesosalpinx was treated similarly marching down the side of the uterus staying lateral to the uterine vessels.  The left side was treated similar after I had moved to the left side.  Upon resuming my positioning the right side of the patient bladder flap was developed anteriorly with sharp dissection and harmonic a 741-month bladder flap.  Photos taken. At this time the abdominal portion of the case was considered completed.  The abdomen was deflated, laparoscopic trocar sleeves were left in situ but pulled close to the abdominal surface, all laparoscopic instruments removed and the abdomen inflated the patient was  then positioned for vaginal portion of the case, consisting of elevating the legs with the yellowfin leg  supports.  We began the second portion of the case the vaginal portion by placing a weighted speculum posterior vagina and grasping the cervix, injecting 10 cc of Marcaine from 8:00 to 4:00 around the cervix and the cervical vaginal junction, and then using Bovie cautery to open the epithelium along this line and then the bladder was elevated anterior using Metzenbaum scissors in a horizontal spreading fashion staying close to the cervix, marching up such that there vaginal retractor could be placed beneath the bladder and elevating the bladder out of the way.  We are not immediately able to enter anteriorly.  It was felt like that we were staying close to the uterus Posterior colpotomy incision was made at this point time and uterosacral ligaments were then grasped clamped cut and suture-ligated on each side beginning on left side and tagging the specimen pedicle, with similar clamping cutting transecting, ligating and tagging on the right side.  The lower cardinal ligaments were clamped cut and suture-ligated and small bites beginning inferiorly and marching up inside of the uterus.  When we reached the level of approximately the uterine vessels it was easy to identify the opening that had been developed in the anterior nasal cavity.  We have been dissecting slightly closer to the uterus, just beneath the avascular plane, upon proper identification of the avascular plane we were able to clamped cut and suture-ligated the remaining portions of the upper cardinal ligaments on each side clamping cutting and ligating with 0 chromic.  Uterus was extracted.  Hemostasis was good. Bladder flap edge anteriorly and posterior peritoneal edge were easily identified.  Pedicles were inspected from below and were hemostatic.  The anterior peritoneum was pulled posteriorly and then reapproximated with a series of 4 running locking sutures of  2-0 chromic. The vaginal cuff was then closed front to back with a series of  interrupted 0 chromic sutures with good hemostasis.  Vaginal packing was not considered necessary urine remained clear. The laparoscopic portion of the case was then resumed.  We changed gloves and gowns, and reapproach the abdomen inflating the abdomen, inspecting the abdomen with no evidence of bleeding or active complication.  The ureters could be easily visualized are unchanged in appearance and were well lateral to the area of the surgery the photos were taken. Abdomen was deflated with a small amount of saline left in the abdomen to assist with evacuation of the pneumoperitoneum, the abdomen deflated camera removed, and the umbilical 10 mm trocar site closed at the fascial level, using S retractors to identify the fascia, placing a single interrupted 0 Vicryl suture under direct visualization and the fascia closing the fascia, then subcuticular 4-0 Vicryl was used to close all 3 trocar sites. Sponge and needle counts were correct the radiofrequency wand was used to confirm the accurate sponge count that had previously been achieved. Patient was then allowed to go to recovery room in stable condition. EBL 150 cc

## 2019-01-12 NOTE — Progress Notes (Signed)
Pt ambulated in room to door. Pt tolerated well. Will continue to encourage further distance in ambulation

## 2019-01-12 NOTE — H&P (Signed)
Preoperative History and Physical  Madison Griffin is a 43 y.o. No obstetric history on file. here for surgical management of suspected Adenomyosis with dyspareunia. No significant preoperative concerns except that the patient has not had vaginal deliveries. I have examined the patient again on 01/08/2019 and mobility seems adequate for vaginal access for hysterectomy.  Proposed surgery: laparoscopic assisted vaginal hysterectomy with bilateral salpingectomy   No past medical history on file.      Past Surgical History:  Procedure Laterality Date  . AUGMENTATION MAMMAPLASTY Bilateral 08/01/2016  . breast inplant    . PLACEMENT OF BREAST IMPLANTS     OB History  No obstetric history on file.  Patient denies any other pertinent gynecologic issues.   No current outpatient medications on file prior to visit.   No current facility-administered medications on file prior to visit.    No Known Allergies  Social History:   reports that she has never smoked. She has never used smokeless tobacco. She reports current alcohol use. She reports that she does not use drugs.       Family History  Problem Relation Age of Onset  . Other Father        cardiac arrest  . Diabetes Mother     Review of Systems: Noncontributory  PHYSICAL EXAM: There were no vitals taken for this visit. General appearance - alert, well appearing, and in no distress Chest - clear to auscultation, no wheezes, rales or rhonchi, symmetric air entry Heart - normal rate and regular rhythm Abdomen - soft, nontender, nondistended, no masses or organomegaly Pelvic -  VAGINA: good vaginal support, pain when palpating vaginal side wall, moderate tenderness over bladder. CERVIX: not visualized due to discomfort of exam UTERUS: tenderness when examining with speculum and finger exam. Extremities - peripheral pulses normal, no pedal edema, no clubbing or cyanosis  Labs: CBC    Component Value Date/Time    WBC 8.4 01/07/2019 1259   RBC 3.93 01/07/2019 1259   HGB 11.6 (L) 01/07/2019 1259   HCT 36.1 01/07/2019 1259   PLT 313 01/07/2019 1259   MCV 91.9 01/07/2019 1259   MCH 29.5 01/07/2019 1259   MCHC 32.1 01/07/2019 1259   RDW 13.3 01/07/2019 1259   CMP Latest Ref Rng & Units 01/07/2019  Glucose 70 - 99 mg/dL 96  BUN 6 - 20 mg/dL 8  Creatinine 7.03 - 4.03 mg/dL 5.24  Sodium 818 - 590 mmol/L 137  Potassium 3.5 - 5.1 mmol/L 4.1  Chloride 98 - 111 mmol/L 104  CO2 22 - 32 mmol/L 25  Calcium 8.9 - 10.3 mg/dL 8.9  Total Protein 6.5 - 8.1 g/dL 7.6  Total Bilirubin 0.3 - 1.2 mg/dL 0.7  Alkaline Phos 38 - 126 U/L 50  AST 15 - 41 U/L 21  ALT 0 - 44 U/L 19     Imaging Studies: GYNECOLOGIC SONOGRAM   [name] . LMP 07/26/2018 she is here for a pelvic sonogram for menorrhagia.  Uterus                      7.5 x 4.3 x 5.3 cm, vol 89 ml, anteverted uterus w/focal anterior heterogeneity 2.2 x 1.6 x 1.7 cm (? Adenomyosis)  Endometrium          7.1 mm, symmetrical, wnl  Right ovary             2.3 x 1.1 x 1.8 cm, wnl  Left ovary  8.9 x 2.4 x 2.3 cm, simple left ovarian cyst 2.5 x 2.2 x 1.5 cm  No free fluid   Technician Comments:  PELVIC US TA/TV: anteverted uteKorearus w/focal anterior heterogeneity 2.2 x 1.6 x 1.7 cm (? Adenomyosis),simple left ovarian cyst 2.5 x 2.2 x 1.5 cm,normal right ovary,EEC 7.1 mm,no free fluid,no pain during ultrasound,ovaries appear mobile     [employer]. [employer] J Carl 08/07/2018 3:03 PM  Clinical Impression and recommendations:  I have reviewed the sonogram results above.  Combined with the patient's current clinical course, below are my impressions and any appropriate recommendations for management based on the sonographic findings:  1.  Heterogenous myometrium with fluid pockets in anterior myometrium most c/w adenomyosis. Normal ovaries, and cervix [name] January   Assessment:     Patient Active Problem List    Diagnosis Date Noted  . Menorrhagia with regular cycle 07/30/2018  Dyspareunia Suspected Adenomyosis  Plan: Patient will undergo surgical management with laparoscopic assisted vaginal hysterectomy with bilateral salpingectomy on 01/12/2019

## 2019-01-12 NOTE — Op Note (Signed)
Please see the brief operative note for surgical details 

## 2019-01-12 NOTE — Anesthesia Postprocedure Evaluation (Signed)
Anesthesia Post Note  Patient: Madison Griffin  Procedure(s) Performed: LAPAROSCOPIC ASSISTED VAGINAL HYSTERECTOMY, bilateral salpingectomy (N/A )  Patient location during evaluation: PACU Anesthesia Type: General Level of consciousness: awake and alert and oriented Pain management: pain level controlled Vital Signs Assessment: post-procedure vital signs reviewed and stable Respiratory status: spontaneous breathing Cardiovascular status: blood pressure returned to baseline and stable Postop Assessment: no apparent nausea or vomiting Anesthetic complications: no     Last Vitals:  Vitals:   01/12/19 1331 01/12/19 1346  BP: 103/65 98/65  Pulse: 85 66  Resp: 17 17  Temp:    SpO2: 100% 98%    Last Pain:  Vitals:   01/12/19 1331  TempSrc:   PainSc: 4                  Brynna Dobos

## 2019-01-12 NOTE — Transfer of Care (Signed)
Immediate Anesthesia Transfer of Care Note  Patient: Madison Griffin  Procedure(s) Performed: LAPAROSCOPIC ASSISTED VAGINAL HYSTERECTOMY, bilateral salpingectomy (N/A )  Patient Location: PACU  Anesthesia Type:General  Level of Consciousness: drowsy and patient cooperative  Airway & Oxygen Therapy: Patient Spontanous Breathing and Patient connected to nasal cannula oxygen  Post-op Assessment: Report given to RN, Post -op Vital signs reviewed and stable and Patient moving all extremities  Post vital signs: Reviewed and stable  Last Vitals:  Vitals Value Taken Time  BP    Temp    Pulse 82 01/12/2019 12:59 PM  Resp    SpO2 98 % 01/12/2019 12:59 PM  Vitals shown include unvalidated device data.  Last Pain:  Vitals:   01/12/19 0841  TempSrc: Oral  PainSc:       Patients Stated Pain Goal: 7 (01/12/19 0839)  Complications: No apparent anesthesia complications

## 2019-01-12 NOTE — Anesthesia Procedure Notes (Signed)
Procedure Name: Intubation Date/Time: 01/12/2019 10:20 AM Performed by: Charmaine Downs, CRNA Pre-anesthesia Checklist: Patient identified, Patient being monitored, Timeout performed, Emergency Drugs available and Suction available Patient Re-evaluated:Patient Re-evaluated prior to induction Oxygen Delivery Method: Circle System Utilized Preoxygenation: Pre-oxygenation with 100% oxygen Induction Type: IV induction Ventilation: Mask ventilation without difficulty Laryngoscope Size: Mac and 3 Grade View: Grade I Tube type: Oral Tube size: 7.0 mm Number of attempts: 1 Airway Equipment and Method: stylet Placement Confirmation: ETT inserted through vocal cords under direct vision,  positive ETCO2 and breath sounds checked- equal and bilateral Secured at: 22 cm Tube secured with: Tape Dental Injury: Teeth and Oropharynx as per pre-operative assessment

## 2019-01-12 NOTE — Anesthesia Preprocedure Evaluation (Signed)
Anesthesia Evaluation  Patient identified by MRN, date of birth, ID band Patient awake    Reviewed: Allergy & Precautions, H&P , NPO status , Patient's Chart, lab work & pertinent test results  Airway Mallampati: I  TM Distance: >3 FB Neck ROM: full    Dental  (+) Upper Dentures   Pulmonary neg pulmonary ROS,    Pulmonary exam normal breath sounds clear to auscultation       Cardiovascular Exercise Tolerance: Good negative cardio ROS   Rhythm:regular Rate:Normal     Neuro/Psych negative neurological ROS  negative psych ROS   GI/Hepatic negative GI ROS, Neg liver ROS,   Endo/Other  negative endocrine ROS  Renal/GU negative Renal ROS  negative genitourinary   Musculoskeletal   Abdominal   Peds  Hematology negative hematology ROS (+)   Anesthesia Other Findings   Reproductive/Obstetrics negative OB ROS                             Anesthesia Physical Anesthesia Plan  ASA: I  Anesthesia Plan: General   Post-op Pain Management:    Induction:   PONV Risk Score and Plan:   Airway Management Planned:   Additional Equipment:   Intra-op Plan:   Post-operative Plan:   Informed Consent: I have reviewed the patients History and Physical, chart, labs and discussed the procedure including the risks, benefits and alternatives for the proposed anesthesia with the patient or authorized representative who has indicated his/her understanding and acceptance.     Dental Advisory Given  Plan Discussed with: CRNA  Anesthesia Plan Comments:         Anesthesia Quick Evaluation

## 2019-01-13 DIAGNOSIS — N888 Other specified noninflammatory disorders of cervix uteri: Secondary | ICD-10-CM | POA: Diagnosis not present

## 2019-01-13 DIAGNOSIS — N920 Excessive and frequent menstruation with regular cycle: Secondary | ICD-10-CM | POA: Diagnosis not present

## 2019-01-13 DIAGNOSIS — N800 Endometriosis of uterus: Secondary | ICD-10-CM | POA: Diagnosis not present

## 2019-01-13 DIAGNOSIS — N941 Unspecified dyspareunia: Secondary | ICD-10-CM | POA: Diagnosis not present

## 2019-01-13 LAB — CBC
HCT: 29.9 % — ABNORMAL LOW (ref 36.0–46.0)
Hemoglobin: 9.5 g/dL — ABNORMAL LOW (ref 12.0–15.0)
MCH: 29.9 pg (ref 26.0–34.0)
MCHC: 31.8 g/dL (ref 30.0–36.0)
MCV: 94 fL (ref 80.0–100.0)
Platelets: 242 10*3/uL (ref 150–400)
RBC: 3.18 MIL/uL — AB (ref 3.87–5.11)
RDW: 13.2 % (ref 11.5–15.5)
WBC: 12.9 10*3/uL — ABNORMAL HIGH (ref 4.0–10.5)
nRBC: 0 % (ref 0.0–0.2)

## 2019-01-13 LAB — BASIC METABOLIC PANEL
Anion gap: 3 — ABNORMAL LOW (ref 5–15)
BUN: 12 mg/dL (ref 6–20)
CO2: 23 mmol/L (ref 22–32)
Calcium: 7.6 mg/dL — ABNORMAL LOW (ref 8.9–10.3)
Chloride: 110 mmol/L (ref 98–111)
Creatinine, Ser: 0.69 mg/dL (ref 0.44–1.00)
GFR calc Af Amer: >60 mL/min (ref >60)
GFR calc non Af Amer: >60 mL/min (ref >60)
Glucose, Bld: 111 mg/dL — ABNORMAL HIGH (ref 70–99)
Potassium: 4.3 mmol/L (ref 3.5–5.1)
Sodium: 136 mmol/L (ref 135–145)

## 2019-01-13 MED ORDER — OXYCODONE-ACETAMINOPHEN 5-325 MG PO TABS
1.0000 | ORAL_TABLET | Freq: Four times a day (QID) | ORAL | Status: DC | PRN
  Administered 2019-01-13: 2 via ORAL
  Filled 2019-01-13: qty 2

## 2019-01-13 MED ORDER — ONDANSETRON HCL 4 MG PO TABS: 4.0000 mg | ORAL_TABLET | Freq: Four times a day (QID) | ORAL | 0 refills | Status: DC | PRN

## 2019-01-13 MED ORDER — OXYCODONE-ACETAMINOPHEN 5-325 MG PO TABS: 1.0000 | ORAL_TABLET | Freq: Four times a day (QID) | ORAL | 0 refills | Status: DC | PRN

## 2019-01-13 MED ORDER — POLYETHYLENE GLYCOL 3350 17 GM/SCOOP PO POWD: 17.0000 g | Freq: Every day | ORAL | 99 refills | Status: DC

## 2019-01-13 NOTE — Progress Notes (Signed)
1 Day Post-Op Procedure(s) (LRB): LAPAROSCOPIC ASSISTED VAGINAL HYSTERECTOMY, bilateral salpingectomy (N/A)  Subjective: Patient reports nausea, incisional pain and tolerating PO.    Objective: I have reviewed patient's vital signs, intake and output and labs. BP (!) 92/50 (BP Location: Right Arm)   Pulse 64   Temp 98.3 F (36.8 C) (Oral)   Resp 12   Ht 5\' 2"  (1.575 m)   Wt 63.3 kg   LMP 12/23/2018 (Exact Date)   SpO2 99%   BMI 25.52 kg/m  Pt runs low bp's  Intake/Output Summary (Last 24 hours) at 01/13/2019 0814 Last data filed at 01/13/2019 0700 Gross per 24 hour  Intake 3946.67 ml  Output 1950 ml  Net 1996.67 ml   CBC Latest Ref Rng & Units 01/13/2019 01/12/2019 01/07/2019  WBC 4.0 - 10.5 K/uL 12.9(H) 16.8(H) 8.4  Hemoglobin 12.0 - 15.0 g/dL 5.3(Z) 11.9(L) 11.6(L)  Hematocrit 36.0 - 46.0 % 29.9(L) 36.6 36.1  Platelets 150 - 400 K/uL 242 295 313     General: alert, cooperative and no distress Resp: clear to auscultation bilaterally GI: soft, non-tender; bowel sounds normal; no masses,  no organomegaly and normal findings: bowel sounds normal Vaginal Bleeding: minimal  Assessment: s/p Procedure(s): LAPAROSCOPIC ASSISTED VAGINAL HYSTERECTOMY, bilateral salpingectomy (N/A): stable  Plan: Advance diet Encourage ambulation Discontinue IV fluids Discharge home  LOS: 0 days    Tilda Burrow 01/13/2019, 8:14 AM

## 2019-01-13 NOTE — Discharge Summary (Signed)
Physician Discharge Summary  Patient ID: Madison Griffin MRN: [id number] DOB/AGE: 06/05/1976 43 y.o.  Admit date: 01/12/2019 Discharge date: 01/13/2019  Admission Diagnoses: Menorrhagia dyspareunia rule out adenomyosis  Discharge Diagnoses: Same also Active Problems:   Status post laparoscopic assisted vaginal hysterectomy (LAVH)   S/P laparoscopic assisted vaginal hysterectomy (LAVH) Status post bilateral salpingectomy for cancer risk reduction  Discharged Condition: good  Hospital Course: This 43 year old female was admitted through day surgery for laparoscopically assisted vaginal hysterectomy bilateral salpingectomy, and admitted overnight for pain management and monitoring she use Dilaudid PCA through the night with intermittent use.  INO was good, input and output.  Hemoglobin went from 11-9.5 with hydration and surgical blood loss.  EBL was 150 cc surgery. Pain was listed as a 0-3.  She will be expected to void prior to going home today Consults: None  Significant Diagnostic Studies: labs:  CBC Latest Ref Rng & Units 01/13/2019 01/12/2019 01/07/2019  WBC 4.0 - 10.5 K/uL 12.9(H) 16.8(H) 8.4  Hemoglobin 12.0 - 15.0 g/dL 4.0(J9.5(L) 11.9(L) 11.6(L)  Hematocrit 36.0 - 46.0 % 29.9(L) 36.6 36.1  Platelets 150 - 400 K/uL 242 295 313   BMP Latest Ref Rng & Units 01/13/2019 01/12/2019 01/07/2019  Glucose 70 - 99 mg/dL 811(B111(H) - 96  BUN 6 - 20 mg/dL 12 - 8  Creatinine 1.470.44 - 1.00 mg/dL 8.290.69 5.620.70 1.300.70  Sodium 135 - 145 mmol/L 136 - 137  Potassium 3.5 - 5.1 mmol/L 4.3 - 4.1  Chloride 98 - 111 mmol/L 110 - 104  CO2 22 - 32 mmol/L 23 - 25  Calcium 8.9 - 10.3 mg/dL 7.6(L) - 8.9     Treatments: surgery: Laparoscopically assisted vaginal hysterectomy bilateral salpingectomy  Discharge Exam: Blood pressure (!) 92/50, pulse 64, temperature 98.3 F (36.8 C), temperature source Oral, resp. rate 12, height 5\' 2"  (1.575 m), weight 63.3 kg, last menstrual period 12/23/2018, SpO2 99 %. General appearance:  alert, cooperative and no distress Head: Normocephalic, without obvious abnormality, atraumatic Resp: clear to auscultation bilaterally GI: soft, non-tender; bowel sounds normal; no masses,  no organomegaly Extremities: extremities normal, atraumatic, no cyanosis or edema and Homans sign is negative, no sign of DVT Incision/Wound: Laparoscopic trocar sites intact with bandages in place no bleeding  Disposition: Discharge disposition: 01-Home or Self Care       Discharge Instructions    Call MD for:  persistant nausea and vomiting   Complete by:  As directed    Call MD for:  severe uncontrolled pain   Complete by:  As directed    Call MD for:  temperature >100.4   Complete by:  As directed    Diet - low sodium heart healthy   Complete by:  As directed    Increase activity slowly   Complete by:  As directed      Allergies as of 01/13/2019   No Known Allergies     Medication List    TAKE these medications   ondansetron 4 MG tablet Commonly known as:  ZOFRAN Take 1 tablet (4 mg total) by mouth every 6 (six) hours as needed for nausea.   oxyCODONE-acetaminophen 5-325 MG tablet Commonly known as:  PERCOCET/ROXICET Take 1-2 tablets by mouth every 6 (six) hours as needed for moderate pain or severe pain.   polyethylene glycol powder powder Commonly known as:  MIRALAX Take 17 g by mouth daily. To prevent constipation      Follow-up Information    Tilda BurrowFerguson, Karrington Studnicka V, MD Follow up in 1 week(s).  Specialties:  Obstetrics and Gynecology, Radiology Contact information: [address] [place] [address] [phone]           Signed: Tilda Burrow 01/13/2019, 8:19 AM

## 2019-01-13 NOTE — Discharge Instructions (Signed)
Laparoscopically Assisted Vaginal Hysterectomy, Care After °This sheet gives you information about how to care for yourself after your procedure. Your health care provider may also give you more specific instructions. If you have problems or questions, contact your health care provider. °What can I expect after the procedure? °After the procedure, it is common to have: °· Soreness and numbness in your incision areas. °· Abdominal pain. You will be given pain medicine to control it. °· Vaginal bleeding and discharge. You will need to use a sanitary napkin after this procedure. °· Sore throat from the breathing tube that was inserted during surgery. °Follow these instructions at home: °Medicines °· Take over-the-counter and prescription medicines only as told by your health care provider. °· Do not take aspirin or ibuprofen. These medicines can cause bleeding. °· Do not drive or use heavy machinery while taking prescription pain medicine. °· Do not drive for 24 hours if you were given a medicine to help you relax (sedative) during the procedure. °Incision care ° °· Follow instructions from your health care provider about how to take care of your incisions. Make sure you: °? Wash your hands with soap and water before you change your bandage (dressing). If soap and water are not available, use hand sanitizer. °? Change your dressing as told by your health care provider. °? Leave stitches (sutures), skin glue, or adhesive strips in place. These skin closures may need to stay in place for 2 weeks or longer. If adhesive strip edges start to loosen and curl up, you may trim the loose edges. Do not remove adhesive strips completely unless your health care provider tells you to do that. °· Check your incision area every day for signs of infection. Check for: °? Redness, swelling, or pain. °? Fluid or blood. °? Warmth. °? Pus or a bad smell. °Activity °· Get regular exercise as told by your health care provider. You may be  told to take short walks every day and go farther each time. °· Return to your normal activities as told by your health care provider. Ask your health care provider what activities are safe for you. °· Do not douche, use tampons, or have sexual intercourse for at least 6 weeks, or until your health care provider gives you permission. °· Do not lift anything that is heavier than 10 lb (4.5 kg), or the limit that your health care provider tells you, until he or she says that it is safe. °General instructions °· Do not take baths, swim, or use a hot tub until your health care provider approves. Take showers instead of baths. °· Do not drive for 24 hours if you received a sedative. °· Do not drive or operate heavy machinery while taking prescription pain medicine. °· To prevent or treat constipation while you are taking prescription pain medicine, your health care provider may recommend that you: °? Drink enough fluid to keep your urine clear or pale yellow. °? Take over-the-counter or prescription medicines. °? Eat foods that are high in fiber, such as fresh fruits and vegetables, whole grains, and beans. °? Limit foods that are high in fat and processed sugars, such as fried and sweet foods. °· Keep all follow-up visits as told by your health care provider. This is important. °Contact a health care provider if: °· You have signs of infection, such as: °? Redness, swelling, or pain around your incision sites. °? Fluid or blood coming from an incision. °? An incision that feels warm to the   touch. °? Pus or a bad smell coming from an incision. °· Your incision breaks open. °· Your pain medicine is not helping. °· You feel dizzy or light-headed. °· You have pain or bleeding when you urinate. °· You have persistent nausea and vomiting. °· You have blood, pus, or a bad-smelling discharge from your vagina. °Get help right away if: °· You have a fever. °· You have severe abdominal pain. °· You have chest pain. °· You have  shortness of breath. °· You faint. °· You have pain, swelling, or redness in your leg. °· You have heavy bleeding from your vagina. °Summary °· After the procedure, it is common to have abdominal pain and vaginal bleeding. °· You should not drive or lift heavy objects until your health care provider says that it is safe. °· Contact your health care provider if you have any symptoms of infection, excessive vaginal bleeding, nausea, vomiting, or shortness of breath. °This information is not intended to replace advice given to you by your health care provider. Make sure you discuss any questions you have with your health care provider. °Document Released: 11/14/2011 Document Revised: 01/21/2017 Document Reviewed: 01/21/2017 °Elsevier Interactive Patient Education © 2019 [employer] ° °

## 2019-01-13 NOTE — Progress Notes (Signed)
IV removed to left hand, patient tolerated well, reviewed AVS with patient who verbalized understanding.  Patient to be transported home by her husband.

## 2019-01-14 ENCOUNTER — Encounter (HOSPITAL_COMMUNITY): Payer: Self-pay | Admitting: Obstetrics and Gynecology

## 2019-01-20 ENCOUNTER — Ambulatory Visit (INDEPENDENT_AMBULATORY_CARE_PROVIDER_SITE_OTHER): Payer: BLUE CROSS/BLUE SHIELD | Admitting: Obstetrics and Gynecology

## 2019-01-20 ENCOUNTER — Encounter: Payer: Self-pay | Admitting: Obstetrics and Gynecology

## 2019-01-20 DIAGNOSIS — R5383 Other fatigue: Secondary | ICD-10-CM | POA: Diagnosis not present

## 2019-01-20 DIAGNOSIS — Z9889 Other specified postprocedural states: Secondary | ICD-10-CM

## 2019-01-20 LAB — POCT HEMOGLOBIN: Hemoglobin: 10.6 g/dL — AB (ref 11–14.6)

## 2019-01-20 NOTE — Progress Notes (Signed)
Patient ID: Madison Griffin, female   DOB: 14-Apr-1976, 43 y.o.   MRN: [id number]  Subjective:  Madison Griffin is a 43 y.o. female now 1 weeks status post laparoscopic assisted vaginal hysterectomy and bilateral salpingectomy.   Denies fever, with normal bowel movements and normal voiding. Has 6-7 of oxycodone medication left. She stopped taking them around Saturday. Husband says she was healing well until this morning when she took a shower, which tired her out more than usual.  Review of Systems Negative except none   Diet:   normal   Bowel movements : normal.  Pain is controlled without any medications.  Objective:  BP 98/68 (BP Location: Left Arm, Patient Position: Sitting, Cuff Size: Normal)   Pulse 73   Ht 5\' 2"  (1.575 m)   Wt 136 lb 12.8 oz (62.1 kg)   LMP 12/23/2018 (Exact Date)   BMI 25.02 kg/m  General:Well developed, well nourished.  No acute distress. Well dressed, somewhat anxious due to discomfort Abdomen: Bowel sounds normal, soft, non-tender. Pelvic Exam: Not examined  Incision(s): Healing well, no drainage, no erythema, no hernia, no swelling, no dehiscence,  Assessment:  Post-Op 1 weeks s/p  laparoscopic assisted vaginal hysterectomy and bilateral salpingectomy.    Doing well postoperatively. Plan:  1.Wound care discussed Aloe lotion and bandage 2. CBC panel, HGB fingerstick 10.6 3. current medications: Ibuprofen 4. Activity restrictions: no bending, stooping, or squatting and no driving 5. return to work: 3 weeks, with half days 6. Follow up in 2 weeks.  By signing my name below, I, Arnette Norris, attest that this documentation has been prepared under the direction and in the presence of Tilda Burrow, MD. Electronically Signed: Arnette Norris Medical Scribe. 01/20/19. 8:56 AM.  I personally performed the services described in this documentation, which was SCRIBED in my presence. The recorded information has been reviewed and considered accurate. It has been  edited as necessary during review. Tilda Burrow, MD

## 2019-02-01 ENCOUNTER — Other Ambulatory Visit: Payer: Self-pay

## 2019-02-01 ENCOUNTER — Ambulatory Visit (INDEPENDENT_AMBULATORY_CARE_PROVIDER_SITE_OTHER): Payer: BLUE CROSS/BLUE SHIELD | Admitting: Obstetrics and Gynecology

## 2019-02-01 ENCOUNTER — Encounter: Payer: Self-pay | Admitting: Obstetrics and Gynecology

## 2019-02-01 VITALS — BP 113/70 | HR 79 | Ht 62.0 in | Wt 136.0 lb

## 2019-02-01 DIAGNOSIS — Z09 Encounter for follow-up examination after completed treatment for conditions other than malignant neoplasm: Secondary | ICD-10-CM

## 2019-02-01 DIAGNOSIS — Z9889 Other specified postprocedural states: Secondary | ICD-10-CM

## 2019-02-01 NOTE — Progress Notes (Addendum)
Patient ID: Madison Griffin, female   DOB: 10-10-1976, 43 y.o.   MRN: [id number]  Subjective:  Madison Griffin is a 43 y.o. female now 3 weeks status post laparoscopic assisted vaginal hysterectomy and bilateral salpingectomy.  She is here to consider going back to work at least part-time.  She is been rather vigorous in her activities, and in the last 3 days is been a little bit tired due to the overextension of her activities.  she denies fever difficulty voiding or difficulty with bowel movements   Was doing well last week until Thursday night into Friday morning. She believes she exerted herself too much, cooking, running errands and cleaning. She spent Friday and including the weekend in bed. Denies fever and chills, but had pain and pressure in pelvic area. It hurt to walk move around and use the restroom, she had normal daily bowel movements w/ no pain. Husband says that she felt good Tuesday and Wednesday and did too many things at one and caused her fatigue. Review of Systems Negative except pelvic pressure   Diet: normal   Bowel movements : normal.  Patient describes it as a sense of pressure she is not taking any medications  Objective:  BP 113/70 (BP Location: Right Arm, Patient Position: Sitting, Cuff Size: Normal)   Pulse 79   Ht '5\' 2"'  (1.575 m)   Wt 136 lb (61.7 kg)   LMP 12/23/2018 (Exact Date)   BMI 24.87 kg/m  General:Well developed, well nourished.  No acute distress. Abdomen: Bowel sounds normal, soft, non-tender. Pelvic Exam:    External Genitalia:  Normal    Vagina: Normal, good vaginal length with good support. Vaginal cuff is healing beautifully, appropriately thickened    Cervix: Surgically absent l    Uterus: Absent    Adnexa/Bimanual: Appropriate sensitivity  Incision(s):  Vaginal cuff healing well, no drainage, no erythema, no hernia, no swelling, no dehiscence  Assessment:  Post-Op 3 weeks s/p laparoscopic assisted vaginal hysterectomy and bilateral  salpingectomy.   Thickened vaginal cuff, appropriately thickened Doing well postoperatively.   Plan:  1. CBC & ESR 2. Current medications. Ibuprofen 3. Activity restrictions: no overhead lifting and no sexual activity, light errands 4. return to work: half days. 5. Follow up in 2 weeks. 6.  Resume ibuprofen every 6 hours when physically active By signing my name below, I, Samul Dada, attest that this documentation has been prepared under the direction and in the presence of Jonnie Kind, MD. Electronically Signed: [place]. 02/01/19. 8:43 AM.  I personally performed the services described in this documentation, which was SCRIBED in my presence. The recorded information has been reviewed and considered accurate. It has been edited as necessary during review. Jonnie Kind, MD

## 2019-02-03 LAB — CBC WITH DIFFERENTIAL/PLATELET
Basophils Absolute: 0.1 10*3/uL (ref 0.0–0.2)
Basos: 1 %
EOS (ABSOLUTE): 0.4 10*3/uL (ref 0.0–0.4)
Eos: 3 %
Hematocrit: 34.8 % (ref 34.0–46.6)
Hemoglobin: 12 g/dL (ref 11.1–15.9)
Immature Grans (Abs): 0 10*3/uL (ref 0.0–0.1)
Immature Granulocytes: 0 %
Lymphocytes Absolute: 2.5 10*3/uL (ref 0.7–3.1)
Lymphs: 22 %
MCH: 31 pg (ref 26.6–33.0)
MCHC: 34.5 g/dL (ref 31.5–35.7)
MCV: 90 fL (ref 79–97)
Monocytes Absolute: 0.8 10*3/uL (ref 0.1–0.9)
Monocytes: 7 %
Neutrophils Absolute: 7.7 10*3/uL — ABNORMAL HIGH (ref 1.4–7.0)
Neutrophils: 67 %
Platelets: 394 10*3/uL (ref 150–450)
RBC: 3.87 x10E6/uL (ref 3.77–5.28)
RDW: 12.6 % (ref 11.7–15.4)
WBC: 11.4 10*3/uL — ABNORMAL HIGH (ref 3.4–10.8)

## 2019-02-03 LAB — SEDIMENTATION RATE: Sed Rate: 12 mm/hr (ref 0–32)

## 2019-02-17 ENCOUNTER — Ambulatory Visit (INDEPENDENT_AMBULATORY_CARE_PROVIDER_SITE_OTHER): Payer: BLUE CROSS/BLUE SHIELD | Admitting: Obstetrics and Gynecology

## 2019-02-17 ENCOUNTER — Other Ambulatory Visit: Payer: Self-pay

## 2019-02-17 ENCOUNTER — Encounter: Payer: Self-pay | Admitting: Obstetrics and Gynecology

## 2019-02-17 VITALS — BP 102/69 | HR 68 | Ht 62.0 in | Wt 137.8 lb

## 2019-02-17 DIAGNOSIS — Z9889 Other specified postprocedural states: Secondary | ICD-10-CM

## 2019-02-17 DIAGNOSIS — Z09 Encounter for follow-up examination after completed treatment for conditions other than malignant neoplasm: Secondary | ICD-10-CM

## 2019-02-17 DIAGNOSIS — Z90710 Acquired absence of both cervix and uterus: Secondary | ICD-10-CM

## 2019-02-17 NOTE — Progress Notes (Signed)
Patient ID: Madison Griffin, female   DOB: [birth year], 43 y.o.   MRN: [id number]  Subjective:  Madison Griffin is a 43 y.o. female now 5 weeks status post laparoscopic assisted vaginal hysterectomy and bilateral salpingectomy.    No more pain compared to reason for operation is none also not having pain from healing from surgery.   Review of Systems Negative except none   Diet:   normal   Bowel movements : normal.  The patient is not having any pain.  Objective:  BP 102/69 (BP Location: Right Arm, Patient Position: Sitting, Cuff Size: Normal)   Pulse 68   Ht 5\' 2"  (1.575 m)   Wt 137 lb 12.8 oz (62.5 kg)   LMP 12/23/2018 (Exact Date)   BMI 25.20 kg/m  General:Well developed, well nourished.  No acute distress. Abdomen: Bowel sounds normal, soft, non-tender. Pelvic Exam:    External Genitalia:  Normal.    Vagina: healthy vaginal secretions, vaginal length appropriate, vaginal cuff extremely well-healed. Good support, normal side walls, nontender vaginal cuff apex    Cervix: surgically absent    Uterus: surgically absent    Adnexa/Bimanual: Normal  Incision(s): vaginal cuff healing well, no drainage, no erythema, no hernia, no swelling, no dehiscence.  Assessment:  Post-Op 5 weeks s/p laparoscopic assisted vaginal hysterectomy and bilateral salpingectomy   Doing well postoperatively.   Plan:  3. Activity restrictions: none 4. return to work: now. 5. Follow up in PRN, 1 year Discussed gradual recovery from this point to 6 months postsurgical, with patient advised that she may resume sexual activity, mom in charge, By signing my name below, I, Arnette Norris, attest that this documentation has been prepared under the direction and in the presence of Tilda Burrow, MD. Electronically Signed: Arnette Norris Medical Scribe. 02/17/19. 9:03 AM.  I personally performed the services described in this documentation, which was SCRIBED in my presence. The recorded information has been reviewed  and considered accurate. It has been edited as necessary during review. Tilda Burrow, MD

## 2019-03-11 DIAGNOSIS — M25532 Pain in left wrist: Secondary | ICD-10-CM | POA: Diagnosis not present

## 2019-03-11 DIAGNOSIS — S60862A Insect bite (nonvenomous) of left wrist, initial encounter: Secondary | ICD-10-CM | POA: Diagnosis not present

## 2019-07-21 DIAGNOSIS — Z0000 Encounter for general adult medical examination without abnormal findings: Secondary | ICD-10-CM | POA: Diagnosis not present

## 2019-07-21 DIAGNOSIS — E782 Mixed hyperlipidemia: Secondary | ICD-10-CM | POA: Diagnosis not present

## 2019-07-28 DIAGNOSIS — M25552 Pain in left hip: Secondary | ICD-10-CM | POA: Diagnosis not present

## 2019-07-28 DIAGNOSIS — E782 Mixed hyperlipidemia: Secondary | ICD-10-CM | POA: Diagnosis not present

## 2019-07-28 DIAGNOSIS — M25521 Pain in right elbow: Secondary | ICD-10-CM | POA: Diagnosis not present

## 2019-07-28 DIAGNOSIS — Z0001 Encounter for general adult medical examination with abnormal findings: Secondary | ICD-10-CM | POA: Diagnosis not present

## 2019-07-30 DIAGNOSIS — M79641 Pain in right hand: Secondary | ICD-10-CM | POA: Diagnosis not present

## 2019-07-30 DIAGNOSIS — M25552 Pain in left hip: Secondary | ICD-10-CM | POA: Diagnosis not present

## 2019-07-30 DIAGNOSIS — M25521 Pain in right elbow: Secondary | ICD-10-CM | POA: Diagnosis not present

## 2019-08-04 DIAGNOSIS — M7711 Lateral epicondylitis, right elbow: Secondary | ICD-10-CM | POA: Diagnosis not present

## 2019-08-04 DIAGNOSIS — M25521 Pain in right elbow: Secondary | ICD-10-CM | POA: Diagnosis not present

## 2019-08-27 DIAGNOSIS — Z23 Encounter for immunization: Secondary | ICD-10-CM | POA: Diagnosis not present

## 2019-09-10 DIAGNOSIS — M25521 Pain in right elbow: Secondary | ICD-10-CM | POA: Diagnosis not present

## 2019-09-15 ENCOUNTER — Other Ambulatory Visit (HOSPITAL_COMMUNITY): Payer: Self-pay | Admitting: Internal Medicine

## 2019-09-15 DIAGNOSIS — Z1231 Encounter for screening mammogram for malignant neoplasm of breast: Secondary | ICD-10-CM

## 2019-09-30 ENCOUNTER — Other Ambulatory Visit: Payer: Self-pay

## 2019-09-30 ENCOUNTER — Ambulatory Visit (HOSPITAL_COMMUNITY)
Admission: RE | Admit: 2019-09-30 | Discharge: 2019-09-30 | Disposition: A | Discharge status: Home / Self Care | Payer: BC Managed Care – PPO | Source: Ambulatory Visit | Attending: Internal Medicine | Admitting: Internal Medicine

## 2019-09-30 DIAGNOSIS — Z1231 Encounter for screening mammogram for malignant neoplasm of breast: Secondary | ICD-10-CM

## 2019-12-01 DIAGNOSIS — G4700 Insomnia, unspecified: Secondary | ICD-10-CM | POA: Diagnosis not present

## 2019-12-01 DIAGNOSIS — K30 Functional dyspepsia: Secondary | ICD-10-CM | POA: Diagnosis not present

## 2019-12-01 DIAGNOSIS — F419 Anxiety disorder, unspecified: Secondary | ICD-10-CM | POA: Diagnosis not present

## 2019-12-01 DIAGNOSIS — R079 Chest pain, unspecified: Secondary | ICD-10-CM | POA: Diagnosis not present

## 2019-12-15 ENCOUNTER — Other Ambulatory Visit (HOSPITAL_COMMUNITY): Payer: Self-pay | Admitting: Internal Medicine

## 2019-12-15 ENCOUNTER — Other Ambulatory Visit: Payer: Self-pay

## 2019-12-15 ENCOUNTER — Other Ambulatory Visit: Payer: Self-pay | Admitting: Internal Medicine

## 2019-12-15 ENCOUNTER — Ambulatory Visit (HOSPITAL_COMMUNITY)
Admission: RE | Admit: 2019-12-15 | Discharge: 2019-12-15 | Disposition: A | Discharge status: Home / Self Care | Payer: BC Managed Care – PPO | Source: Ambulatory Visit | Attending: Internal Medicine | Admitting: Internal Medicine

## 2019-12-15 DIAGNOSIS — G4700 Insomnia, unspecified: Secondary | ICD-10-CM | POA: Diagnosis not present

## 2019-12-15 DIAGNOSIS — R161 Splenomegaly, not elsewhere classified: Secondary | ICD-10-CM | POA: Diagnosis not present

## 2019-12-15 DIAGNOSIS — F321 Major depressive disorder, single episode, moderate: Secondary | ICD-10-CM | POA: Diagnosis not present

## 2019-12-15 DIAGNOSIS — R197 Diarrhea, unspecified: Secondary | ICD-10-CM | POA: Diagnosis not present

## 2019-12-15 DIAGNOSIS — R1110 Vomiting, unspecified: Secondary | ICD-10-CM | POA: Diagnosis not present

## 2019-12-15 DIAGNOSIS — Z6825 Body mass index (BMI) 25.0-25.9, adult: Secondary | ICD-10-CM | POA: Diagnosis not present

## 2019-12-15 DIAGNOSIS — Z0001 Encounter for general adult medical examination with abnormal findings: Secondary | ICD-10-CM | POA: Diagnosis not present

## 2019-12-15 DIAGNOSIS — F419 Anxiety disorder, unspecified: Secondary | ICD-10-CM | POA: Diagnosis not present

## 2019-12-15 DIAGNOSIS — E782 Mixed hyperlipidemia: Secondary | ICD-10-CM | POA: Diagnosis not present

## 2019-12-15 DIAGNOSIS — M25521 Pain in right elbow: Secondary | ICD-10-CM | POA: Diagnosis not present

## 2019-12-15 DIAGNOSIS — R1030 Lower abdominal pain, unspecified: Secondary | ICD-10-CM | POA: Diagnosis not present

## 2019-12-15 DIAGNOSIS — N939 Abnormal uterine and vaginal bleeding, unspecified: Secondary | ICD-10-CM | POA: Diagnosis not present

## 2019-12-15 MED ORDER — IOHEXOL 300 MG/ML  SOLN
100.0000 mL | Freq: Once | INTRAMUSCULAR | Status: AC | PRN
  Administered 2019-12-15: 100 mL via INTRAVENOUS

## 2019-12-15 MED ORDER — IOHEXOL 300 MG/ML  SOLN
30.0000 mL | Freq: Once | INTRAMUSCULAR | Status: AC | PRN
  Administered 2019-12-15: 30 mL via ORAL

## 2020-01-07 IMAGING — MG DIGITAL SCREENING BILATERAL MAMMOGRAM WITH IMPLANTS, CAD AND TOM
8 of 12 series · 8 of 28 positions shown · non-contrast
Comparison: Previous exam(s).

ACR Breast Density Category C: Heterogeneously dense.

CLINICAL DATA: Screening.

EXAM:
DIGITAL SCREENING BILATERAL MAMMOGRAM WITH IMPLANTS, CAD AND TOMO
The patient has retropectoral implants. Standard and implant
displaced views were performed.

[R CC (1 of 2)]
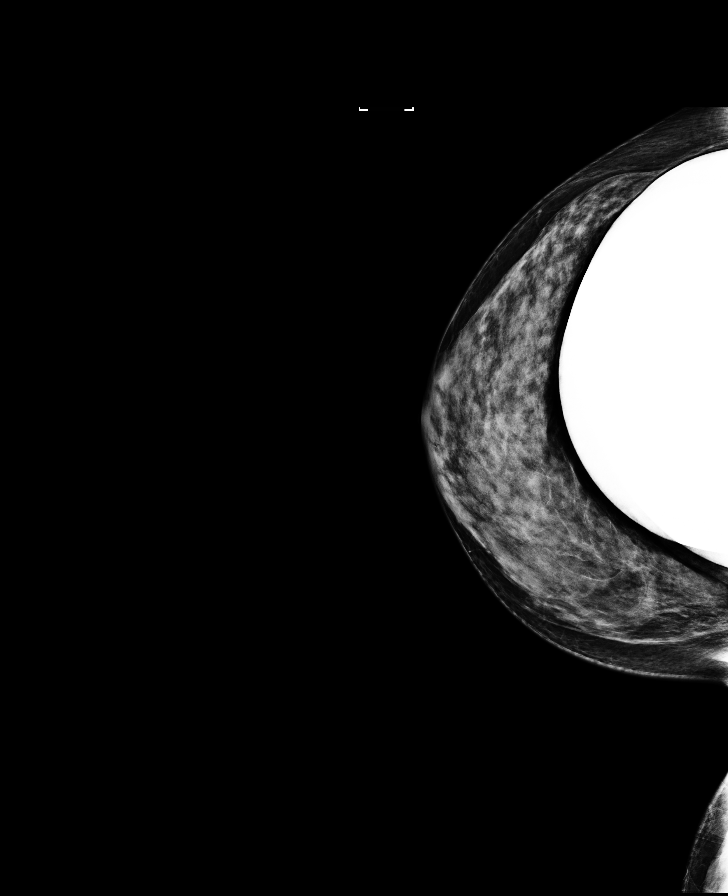

[L MLO (1 of 2)]
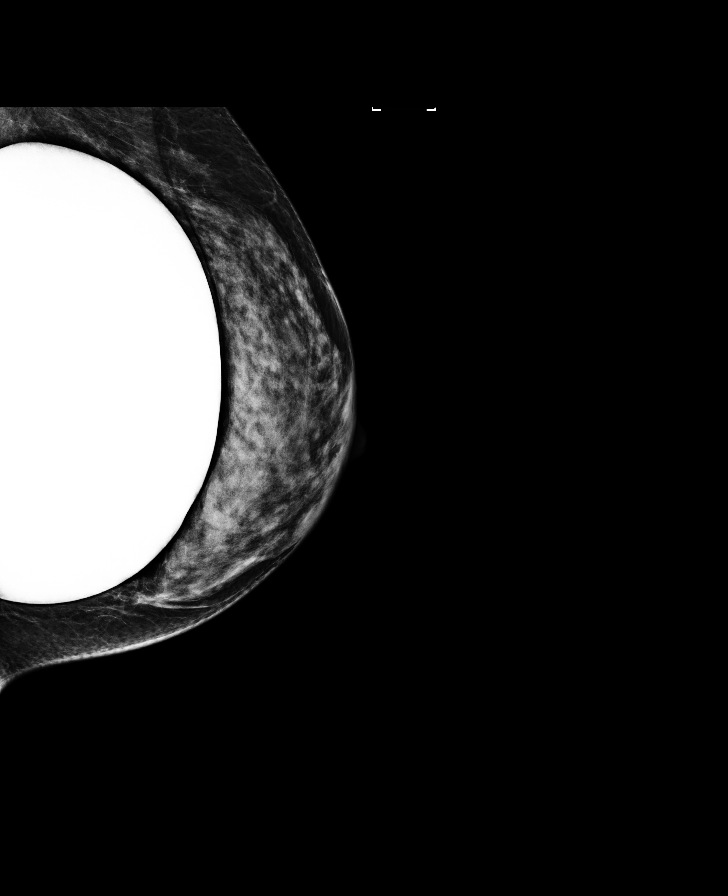

[L CC (1 of 2)]
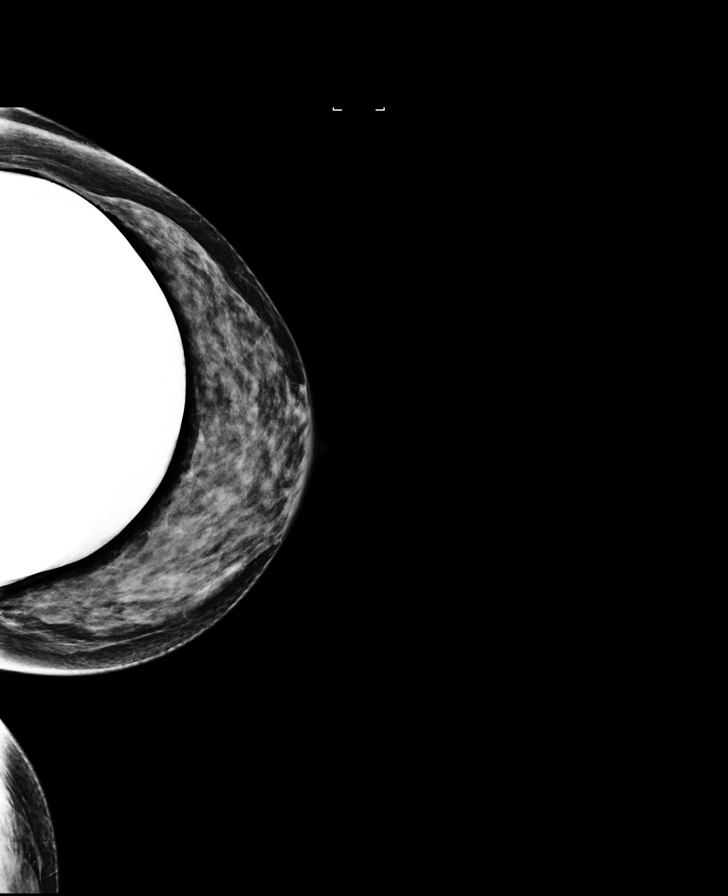

[R MLO (1 of 2)]
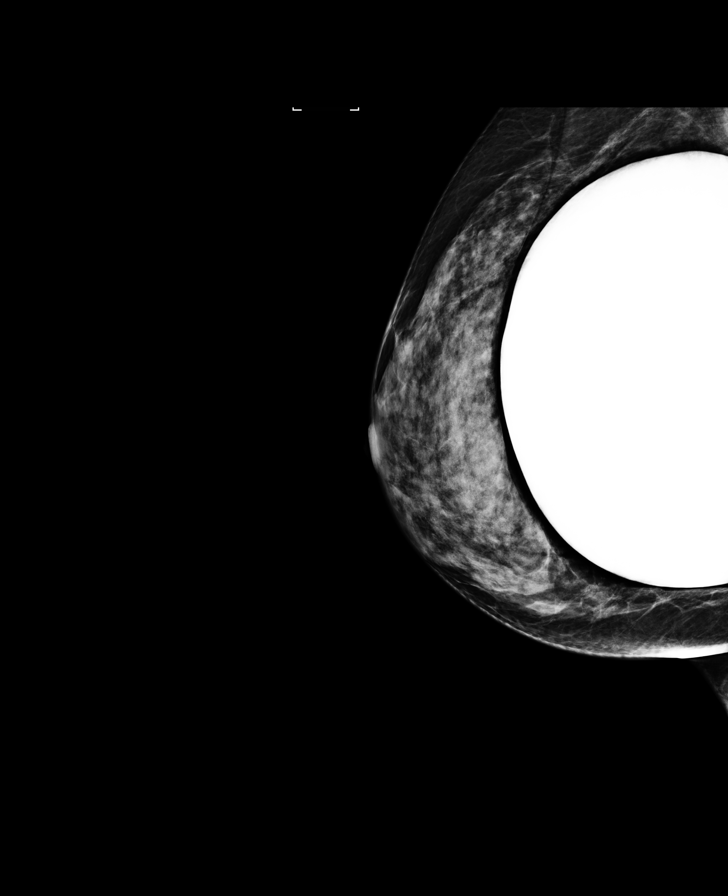

[L CC (2 of 2)]
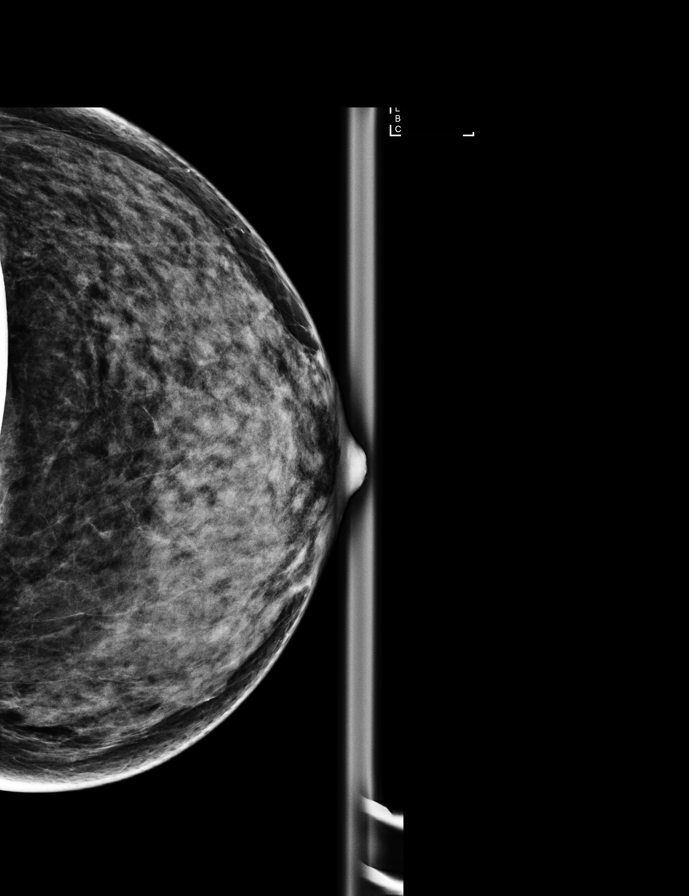

[R MLO (2 of 2)]
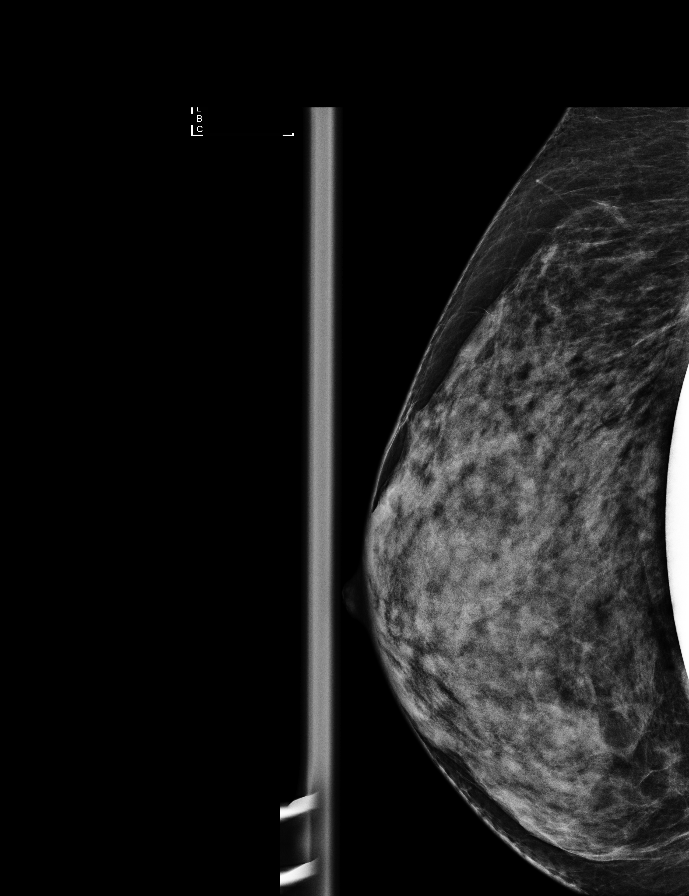

[L MLO (2 of 2)]
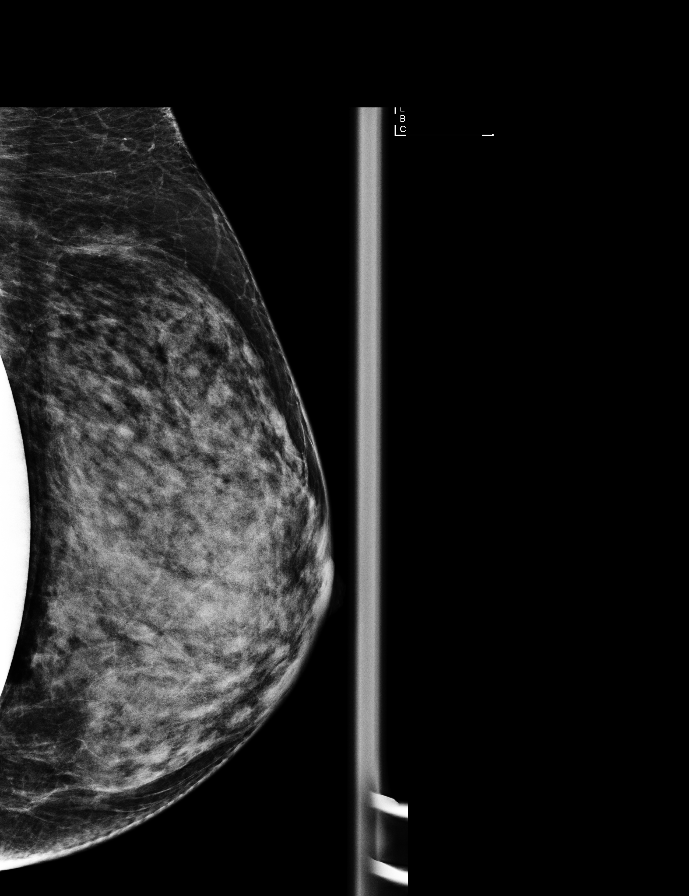

[R CC (2 of 2)]
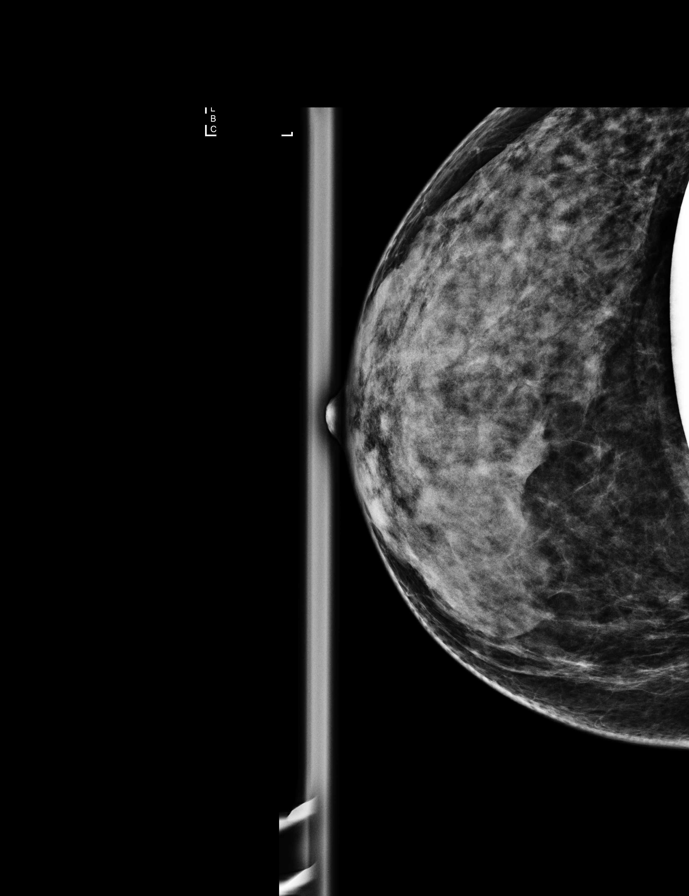

[8 of 28 positions shown; findings below may reference images not displayed]

FINDINGS: In the right breast, a possible asymmetry warrants further
evaluation. In the left breast, no suspicious masses or malignant
type calcifications are identified. Images were processed with CAD.
IMPRESSION: Further evaluation is suggested for possible asymmetry in the right
breast.

RECOMMENDATION:
Diagnostic mammogram and possibly ultrasound of the right breast.
(Code:9A-[id number])

The patient will be contacted regarding the findings, and additional
imaging will be scheduled.

BI-RADS CATEGORY  0: Incomplete. Need additional imaging evaluation
and/or prior mammograms for comparison.

## 2020-01-16 DIAGNOSIS — Z20828 Contact with and (suspected) exposure to other viral communicable diseases: Secondary | ICD-10-CM | POA: Diagnosis not present

## 2020-02-13 ENCOUNTER — Other Ambulatory Visit: Payer: Self-pay | Admitting: Obstetrics and Gynecology

## 2020-02-14 NOTE — Telephone Encounter (Signed)
Miralax rx refilled prn.

## 2020-02-19 DIAGNOSIS — Z23 Encounter for immunization: Secondary | ICD-10-CM | POA: Diagnosis not present

## 2020-10-25 ENCOUNTER — Other Ambulatory Visit (HOSPITAL_COMMUNITY): Payer: Self-pay | Admitting: Adult Health Nurse Practitioner

## 2020-10-25 DIAGNOSIS — Z1231 Encounter for screening mammogram for malignant neoplasm of breast: Secondary | ICD-10-CM

## 2020-11-08 ENCOUNTER — Ambulatory Visit (HOSPITAL_COMMUNITY): Payer: BC Managed Care – PPO

## 2020-11-15 ENCOUNTER — Ambulatory Visit (HOSPITAL_COMMUNITY): Payer: Self-pay

## 2020-11-20 ENCOUNTER — Ambulatory Visit (HOSPITAL_COMMUNITY): Payer: Self-pay

## 2022-10-22 ENCOUNTER — Encounter: Payer: Self-pay | Admitting: Internal Medicine

## 2022-11-27 ENCOUNTER — Encounter: Payer: Self-pay | Admitting: Gastroenterology

## 2022-11-27 NOTE — H&P (View-Only) (Signed)
GI Office Note    Referring Provider: Roe Rutherford, NP Primary Care Physician:  Roe Rutherford, NP  Primary Gastroenterologist: Hennie Duos. Marletta Lor, DO  Chief Complaint   Chief Complaint  Patient presents with   positive cologuard     History of Present Illness   Madison Griffin is a 46 y.o. female presenting today at the request of Roe Rutherford, NP for positive Cologuard.  Labs 08/30/2022: Unremarkable CBC and CMP.  TSH within normal limits.  B12 and folate within normal limits.  Vitamin D low at 22.9.  Elevated cholesterol at 241, triglycerides 131, LDL 166.  Positive Cologuard dated 10/08/2022.  Today: Last menstrual period: hysterectomy in 2020  Family history of colon cancer/colon polyps? - None she is aware of.   Has alternating constipation and diarrhea and this is chronic issue. Denies abdominal pain. Has more constipation then diarrhea. Tries to incorporate fiber in her diet and there is a tea she drinks about once per week to help keep her regular. Has a BM about 1-2 times per week. Has tried miralax in the past but not seem to work as well. Has tried stool softeners as well and that did not work either. Does have urgency when she has to go. Denies melena, brbrpr. Eats small meals. No N/V. Drinks unsweet hot tea mostly during the day, 2 bottles water daily. Does drink wine, 2 glasses per week.   Recently had her thyroid medication doubled. Has had some lack of appetite. Lost 10 lbs within the last 6 months to 1 year. More so a fear of eating certain things.   Does get hemorrhoids - has occasional bleeding.   Has some reflux and trouble swallowing and has some pain with swallowing ad it goes away when she stops eating. May take famotidine when this happens. Depends on what she eats but unable to eat the chicken with chikfila. Triggers are spicy, bread, fried foods, tomato foods. Dysphagia is more so with certain foods. Has tried otc medications prior and used to  take daily PPI but finds famotidine is more helpful.   Denies gluten allergy.    Current Outpatient Medications  Medication Sig Dispense Refill   cyanocobalamin (VITAMIN B12) 1000 MCG/ML injection INJECT 1 ML INTO THE MUSCLE EVERY 30 DAYS     DULoxetine (CYMBALTA) 60 MG capsule Take 60 mg by mouth daily.     Eszopiclone 3 MG TABS Take 3 mg by mouth at bedtime as needed.     levothyroxine (SYNTHROID) 50 MCG tablet Take 50 mcg by mouth daily.     polyethylene glycol powder (GLYCOLAX/MIRALAX) 17 GM/SCOOP powder TAKE 17 G BY MOUTH DAILY TO PREVENT CONSTIPATION (Patient not taking: Reported on 11/28/2022) 238 g prn   No current facility-administered medications for this visit.    Past Medical History:  Diagnosis Date   Hypothyroidism     Past Surgical History:  Procedure Laterality Date   AUGMENTATION MAMMAPLASTY Bilateral 08/01/2016   breast inplant     LAPAROSCOPIC ASSISTED VAGINAL HYSTERECTOMY N/A 01/12/2019   Procedure: LAPAROSCOPIC ASSISTED VAGINAL HYSTERECTOMY, bilateral salpingectomy;  Surgeon: Tilda Burrow, MD;  Location: [facility];  Service: Gynecology;  Laterality: N/A;   PLACEMENT OF BREAST IMPLANTS      Family History  Problem Relation Age of Onset   Other Father        cardiac arrest   Diabetes Mother     Allergies as of 11/28/2022   (No Known Allergies)    Social History  Socioeconomic History   Marital status: Married    Spouse name: Not on file   Number of children: Not on file   Years of education: Not on file   Highest education level: Not on file  Occupational History   Not on file  Tobacco Use   Smoking status: Never   Smokeless tobacco: Never  Vaping Use   Vaping Use: Never used  Substance and Sexual Activity   Alcohol use: Yes    Comment: occ   Drug use: Never   Sexual activity: Not Currently    Birth control/protection: Surgical    Comment: hyst  Other Topics Concern   Not on file  Social History Narrative   Not on file   Social  Determinants of Health   Financial Resource Strain: Unknown (01/12/2019)   Overall Financial Resource Strain (CARDIA)    Difficulty of Paying Living Expenses: Patient refused  Food Insecurity: Unknown (01/12/2019)   Hunger Vital Sign    Worried About Running Out of Food in the Last Year: Patient refused    Ran Out of Food in the Last Year: Patient refused  Transportation Needs: Unknown (01/12/2019)   PRAPARE - Transportation    Lack of Transportation (Medical): Patient refused    Lack of Transportation (Non-Medical): Patient refused  Physical Activity: Unknown (01/12/2019)   Exercise Vital Sign    Days of Exercise per Week: Patient refused    Minutes of Exercise per Session: Patient refused  Stress: Unknown (01/12/2019)   [employer] of Occupational Health - Occupational Stress Questionnaire    Feeling of Stress : Patient refused  Social Connections: Unknown (01/12/2019)   Social Connection and Isolation Panel [NHANES]    Frequency of Communication with Friends and Family: Patient refused    Frequency of Social Gatherings with Friends and Family: Patient refused    Attends Religious Services: Patient refused    Active Member of Clubs or Organizations: Patient refused    Attends Banker Meetings: Patient refused    Marital Status: Patient refused  Intimate Partner Violence: Unknown (01/12/2019)   Humiliation, Afraid, Rape, and Kick questionnaire    Fear of Current or Ex-Partner: Patient refused    Emotionally Abused: Patient refused    Physically Abused: Patient refused    Sexually Abused: Patient refused     Review of Systems   Gen: Denies any fever, chills, fatigue, weight loss, lack of appetite.  CV: Denies chest pain, heart palpitations, peripheral edema, syncope.  Resp: Denies shortness of breath at rest or with exertion. Denies wheezing or cough.  GI: see HPI GU : Denies urinary burning, urinary frequency, urinary hesitancy MS: Denies joint pain, muscle weakness,  cramps, or limitation of movement.  Derm: Denies rash, itching, dry skin Psych: Denies depression, anxiety, memory loss, and confusion Heme: Denies bruising, bleeding, and enlarged lymph nodes.   Physical Exam   BP 105/70 (BP Location: Right Arm, Patient Position: Sitting, Cuff Size: Normal)   Pulse 67   Temp 97.7 F (36.5 C) (Temporal)   Ht 5\' 2"  (1.575 m)   Wt 140 lb 6.4 oz (63.7 kg)   LMP 12/23/2018 (Exact Date)   SpO2 100%   BMI 25.68 kg/m   General:   Alert and oriented. Pleasant and cooperative. Well-nourished and well-developed.  Head:  Normocephalic and atraumatic. Eyes:  Without icterus, sclera clear and conjunctiva pink.  Ears:  Normal auditory acuity. Mouth:  No deformity or lesions, oral mucosa pink.  Lungs:  Clear to auscultation bilaterally. No  wheezes, rales, or rhonchi. No distress.  Heart:  S1, S2 present without murmurs appreciated.  Abdomen:  +BS, soft,  non-distended.  Mild lower abdominal tenderness.  No HSM noted. No guarding or rebound. No masses appreciated.  Rectal:  Deferred  Msk:  Symmetrical without gross deformities. Normal posture. Extremities:  Without edema. Neurologic:  Alert and  oriented x4;  grossly normal neurologically. Skin:  Intact without significant lesions or rashes. Psych:  Alert and cooperative. Normal mood and affect.   Assessment   Rolena Knutson is a 46 y.o. female with a history of fibromyalgia, hypothyroidism, insomnia, vitamin D deficiency presenting today for evaluation after receiving positive Cologuard.  Screening for colon cancer, positive Cologuard: Cologuard positive test 10/08/22.  Denies any family history of colon cancer or colon polyps.  Has had a mild lack of appetite and some weight loss within the last year with some recent weight gain, however she has been eating less and feels like this may possibly be related to GERD or her thyroid.  She does have some constipation as stated below as well as associated  hemorrhoids which is likely cause of intermittent toilet tissue hematochezia.  Given her positive Cologuard we are proceeding with a colonoscopy in the near future with Dr. Marletta Lor.  Her prep will be augmented given her constipation.  Constipation: Chronic.  Likely with some overflow diarrhea at times.  Has significant urgency with about 1 bowel movement per week.  Denies any abdominal pain.  Has tried MiraLAX and over-the-counter stool softeners in the past without much relief.  Typically has a special tea that she drinks once a week that helps her have a bowel movement.  We discussed a high-fiber diet and will provide samples of Linzess 72 mcg daily for her.  She is to call with a progress report in 1 week and if helpful prescription can be sent in.  We discussed potential side effects of diarrhea.  GERD, dysphagia: Also with chronic reflux occurring at times.  Typically only with certain foods such as spicy and tomato-based products.  Also [employer] tends to cause her symptoms at times.  The certain foods also can make her feel like she has a trouble swallowing however when she takes famotidine all of her symptoms resolved.  No concern for any significant ongoing dysphagia, likely secondary to reflux.  She states she has tried over-the-counter PPI before without any significant relief, famotidine seems to work best for her.  She will continue this, I mention taking daily however she would like to continue as needed as she feels like her symptoms are few and far between.  GERD diet societal modifications addressed.  PLAN   Proceed with colonoscopy with propofol by Dr. Marletta Lor  in near future: the risks, benefits, and alternatives have been discussed with the patient in detail. The patient states understanding and desires to proceed. ASA 2  Linzess 72 mcg daily for 3 days 2.   Linzess 72 mcg daily, samples provided. 3.   High fiber diet 4.   Adequate hydration, 3-4 bottles water daily.  5.   Continue  famotidine daily as needed. 6.   GERD diet/lifestyle modifications 7.   Follow up in 4 months.    Brooke Bonito, MSN, FNP-BC, AGACNP-BC [facility]

## 2022-11-27 NOTE — Progress Notes (Unsigned)
GI Office Note    Referring Provider: Roe Rutherford, NP Primary Care Physician:  Roe Rutherford, NP  Primary Gastroenterologist: Hennie Duos. Marletta Lor, DO  Chief Complaint   No chief complaint on file.   History of Present Illness   Madison Griffin is a 46 y.o. female presenting today at the request of Roe Rutherford, NP for ***positive Cologuard.  Labs 08/30/2022: Unremarkable CBC and CMP.  TSH within normal limits.  B12 and folate within normal limits.  Vitamin D low at 22.9.  Elevated cholesterol at 241, triglycerides 131, LDL 166.  Positive Cologuard dated 10/08/2022.  Today: Last menstrual period:   Family history of colon cancer/colon polyps? -   Current Outpatient Medications  Medication Sig Dispense Refill   polyethylene glycol powder (GLYCOLAX/MIRALAX) 17 GM/SCOOP powder TAKE 17 G BY MOUTH DAILY TO PREVENT CONSTIPATION 238 g prn   No current facility-administered medications for this visit.    No past medical history on file.  Past Surgical History:  Procedure Laterality Date   AUGMENTATION MAMMAPLASTY Bilateral 08/01/2016   breast inplant     LAPAROSCOPIC ASSISTED VAGINAL HYSTERECTOMY N/A 01/12/2019   Procedure: LAPAROSCOPIC ASSISTED VAGINAL HYSTERECTOMY, bilateral salpingectomy;  Surgeon: Tilda Burrow, MD;  Location: [facility];  Service: Gynecology;  Laterality: N/A;   PLACEMENT OF BREAST IMPLANTS      Family History  Problem Relation Age of Onset   Other Father        cardiac arrest   Diabetes Mother     Allergies as of 11/28/2022   (No Known Allergies)    Social History   Socioeconomic History   Marital status: Married    Spouse name: Not on file   Number of children: Not on file   Years of education: Not on file   Highest education level: Not on file  Occupational History   Not on file  Tobacco Use   Smoking status: Never   Smokeless tobacco: Never  Vaping Use   Vaping Use: Never used  Substance and Sexual Activity   Alcohol use:  Yes    Comment: occ   Drug use: Never   Sexual activity: Not Currently    Birth control/protection: Surgical    Comment: hyst  Other Topics Concern   Not on file  Social History Narrative   Not on file   Social Determinants of Health   Financial Resource Strain: Unknown (01/12/2019)   Overall Financial Resource Strain (CARDIA)    Difficulty of Paying Living Expenses: Patient refused  Food Insecurity: Unknown (01/12/2019)   Hunger Vital Sign    Worried About Running Out of Food in the Last Year: Patient refused    Ran Out of Food in the Last Year: Patient refused  Transportation Needs: Unknown (01/12/2019)   PRAPARE - Transportation    Lack of Transportation (Medical): Patient refused    Lack of Transportation (Non-Medical): Patient refused  Physical Activity: Unknown (01/12/2019)   Exercise Vital Sign    Days of Exercise per Week: Patient refused    Minutes of Exercise per Session: Patient refused  Stress: Unknown (01/12/2019)   [employer] of Occupational Health - Occupational Stress Questionnaire    Feeling of Stress : Patient refused  Social Connections: Unknown (01/12/2019)   Social Connection and Isolation Panel [NHANES]    Frequency of Communication with Friends and Family: Patient refused    Frequency of Social Gatherings with Friends and Family: Patient refused    Attends Religious Services: Patient refused    Active  Member of Clubs or Organizations: Patient refused    Attends Banker Meetings: Patient refused    Marital Status: Patient refused  Intimate Partner Violence: Unknown (01/12/2019)   Humiliation, Afraid, Rape, and Kick questionnaire    Fear of Current or Ex-Partner: Patient refused    Emotionally Abused: Patient refused    Physically Abused: Patient refused    Sexually Abused: Patient refused     Review of Systems   Gen: Denies any fever, chills, fatigue, weight loss, lack of appetite.  CV: Denies chest pain, heart palpitations, peripheral  edema, syncope.  Resp: Denies shortness of breath at rest or with exertion. Denies wheezing or cough.  GI: see HPI GU : Denies urinary burning, urinary frequency, urinary hesitancy MS: Denies joint pain, muscle weakness, cramps, or limitation of movement.  Derm: Denies rash, itching, dry skin Psych: Denies depression, anxiety, memory loss, and confusion Heme: Denies bruising, bleeding, and enlarged lymph nodes.   Physical Exam   LMP 12/23/2018 (Exact Date)   General:   Alert and oriented. Pleasant and cooperative. Well-nourished and well-developed.  Head:  Normocephalic and atraumatic. Eyes:  Without icterus, sclera clear and conjunctiva pink.  Ears:  Normal auditory acuity. Mouth:  No deformity or lesions, oral mucosa pink.  Lungs:  Clear to auscultation bilaterally. No wheezes, rales, or rhonchi. No distress.  Heart:  S1, S2 present without murmurs appreciated.  Abdomen:  +BS, soft, non-tender and non-distended. No HSM noted. No guarding or rebound. No masses appreciated.  Rectal:  Deferred  Msk:  Symmetrical without gross deformities. Normal posture. Extremities:  Without edema. Neurologic:  Alert and  oriented x4;  grossly normal neurologically. Skin:  Intact without significant lesions or rashes. Psych:  Alert and cooperative. Normal mood and affect.   Assessment   Madison Griffin is a 46 y.o. female with a history of fibromyalgia, hypothyroidism, insomnia, vitamin D deficiency*** presenting today for evaluation after receiving positive Cologuard.  Screening for colon cancer, positive Cologuard:    PLAN   *** Proceed with colonoscopy with propofol by Dr. Marletta Lor  in near future: the risks, benefits, and alternatives have been discussed with the patient in detail. The patient states understanding and desires to proceed. ASA 2 *** Urine pregnancy?    Brooke Bonito, MSN, FNP-BC, AGACNP-BC [facility]

## 2022-11-28 ENCOUNTER — Encounter: Payer: Self-pay | Admitting: *Deleted

## 2022-11-28 ENCOUNTER — Ambulatory Visit: Payer: 59 | Admitting: Gastroenterology

## 2022-11-28 ENCOUNTER — Encounter: Payer: Self-pay | Admitting: Gastroenterology

## 2022-11-28 ENCOUNTER — Telehealth: Payer: Self-pay | Admitting: *Deleted

## 2022-11-28 VITALS — BP 105/70 | HR 67 | Temp 97.7°F | Ht 62.0 in | Wt 140.4 lb

## 2022-11-28 DIAGNOSIS — Z1211 Encounter for screening for malignant neoplasm of colon: Secondary | ICD-10-CM

## 2022-11-28 DIAGNOSIS — K5904 Chronic idiopathic constipation: Secondary | ICD-10-CM

## 2022-11-28 DIAGNOSIS — R195 Other fecal abnormalities: Secondary | ICD-10-CM

## 2022-11-28 DIAGNOSIS — R1310 Dysphagia, unspecified: Secondary | ICD-10-CM

## 2022-11-28 DIAGNOSIS — K219 Gastro-esophageal reflux disease without esophagitis: Secondary | ICD-10-CM

## 2022-11-28 MED ORDER — PEG 3350-KCL-NA BICARB-NACL 420 G PO SOLR: 4000.0000 mL | Freq: Once | ORAL | 0 refills | Status: AC

## 2022-11-28 NOTE — Telephone Encounter (Signed)
[employer] PA: Notification or Prior Authorization is not required for the requested services  This [employer] plan does not currently require a prior authorization for these services. If you have general questions about the prior authorization requirements, please call us at [phone] or visit [email or web address] > Clinician Resources > Advance and Admission Notification Requirements. The number above acknowledges your notification. Please write this number down for future reference. Notification is not a guarantee of coverage or payment.  Decision ID #:[id number]

## 2022-11-28 NOTE — Patient Instructions (Addendum)
We are scheduling you for a colonoscopy in the near future with Dr. Marletta Lor.   For constipation I am providing you with some sampels of Linzess 72 mcg to try one tablet daily and I would like for you to save one box to use prior to your colon prep.  Please let me know in about a week how you are doing and if you would like a prescription sent in.  For constipation recommend you following a high-fiber diet, it may be beneficial for you to take a fiber supplement such as Benefiber (I recommend 2-3 teaspoons daily in 8 ounces of fluid of your choice).  For your reflux: -You may continue to use famotidine as needed. -Follow a GERD diet:  Avoid fried, fatty, greasy, spicy, citrus foods. Avoid caffeine and carbonated beverages. Avoid chocolate. Try eating 4-6 small meals a day rather than 3 large meals. Do not eat within 3 hours of laying down. Prop head of bed up on wood or bricks to create a 6 inch incline.  It was a pleasure to meet you today!  I hope you have a wonderful Christmas and a happy new year!  It was a pleasure to see you today. I want to create trusting relationships with patients. If you receive a survey regarding your visit,  I greatly appreciate you taking time to fill this out on paper or through your MyChart. I value your feedback.  Brooke Bonito, MSN, FNP-BC, AGACNP-BC [facility]

## 2022-12-13 ENCOUNTER — Ambulatory Visit (HOSPITAL_COMMUNITY): Payer: 59 | Admitting: Anesthesiology

## 2022-12-13 ENCOUNTER — Encounter (HOSPITAL_COMMUNITY)
Admission: RE | Disposition: A | Discharge status: Home / Self Care | Payer: Self-pay | Source: Home / Self Care | Attending: Internal Medicine

## 2022-12-13 ENCOUNTER — Encounter (HOSPITAL_COMMUNITY): Payer: Self-pay

## 2022-12-13 ENCOUNTER — Ambulatory Visit (HOSPITAL_COMMUNITY)
Admission: RE | Admit: 2022-12-13 | Discharge: 2022-12-13 | Disposition: A | Discharge status: Home / Self Care | Payer: 59 | Attending: Internal Medicine | Admitting: Internal Medicine

## 2022-12-13 ENCOUNTER — Other Ambulatory Visit: Payer: Self-pay

## 2022-12-13 DIAGNOSIS — E039 Hypothyroidism, unspecified: Secondary | ICD-10-CM | POA: Insufficient documentation

## 2022-12-13 DIAGNOSIS — Z1211 Encounter for screening for malignant neoplasm of colon: Secondary | ICD-10-CM

## 2022-12-13 DIAGNOSIS — K5900 Constipation, unspecified: Secondary | ICD-10-CM

## 2022-12-13 DIAGNOSIS — R195 Other fecal abnormalities: Secondary | ICD-10-CM | POA: Diagnosis not present

## 2022-12-13 DIAGNOSIS — K648 Other hemorrhoids: Secondary | ICD-10-CM | POA: Diagnosis not present

## 2022-12-13 HISTORY — PX: COLONOSCOPY WITH PROPOFOL: SHX5780

## 2022-12-13 SURGERY — COLONOSCOPY WITH PROPOFOL
Anesthesia: General

## 2022-12-13 MED ORDER — PROPOFOL 10 MG/ML IV BOLUS
INTRAVENOUS | Status: DC | PRN
  Administered 2022-12-13: 70 mg via INTRAVENOUS

## 2022-12-13 MED ORDER — LIDOCAINE HCL (CARDIAC) PF 100 MG/5ML IV SOSY
PREFILLED_SYRINGE | INTRAVENOUS | Status: DC | PRN
  Administered 2022-12-13: 50 mg via INTRATRACHEAL

## 2022-12-13 MED ORDER — LACTATED RINGERS IV SOLN: INTRAVENOUS | Status: DC

## 2022-12-13 MED ORDER — PROPOFOL 500 MG/50ML IV EMUL
INTRAVENOUS | Status: DC | PRN
  Administered 2022-12-13: 150 ug/kg/min via INTRAVENOUS

## 2022-12-13 NOTE — Transfer of Care (Signed)
Immediate Anesthesia Transfer of Care Note  Patient: Madison Griffin  Procedure(s) Performed: COLONOSCOPY WITH PROPOFOL  Patient Location: Endoscopy Unit  Anesthesia Type:General  Level of Consciousness: sedated and patient cooperative  Airway & Oxygen Therapy: Patient Spontanous Breathing  Post-op Assessment: Report given to RN, Post -op Vital signs reviewed and stable, and Patient moving all extremities  Post vital signs: Reviewed and stable  Last Vitals:  Vitals Value Taken Time  BP    Temp    Pulse    Resp    SpO2      Last Pain:  Vitals:   12/13/22 0859  TempSrc: Oral  PainSc:          Complications: No notable events documented.

## 2022-12-13 NOTE — Op Note (Signed)
[facility] Patient Name: Madison Griffin Procedure Date: 12/13/2022 8:31 AM MRN: [id number] Date of Birth: 02/23/1976 Attending MD: Elon Alas. Abbey Chatters , [place], [id number] CSN: [id number] Age: 47 Admit Type: Outpatient Procedure:                Colonoscopy Indications:              Screening for colorectal malignant neoplasm,                            Incidental - Positive Cologuard test Providers:                Elon Alas. Abbey Chatters, DO, Crystal Page, Wynonia Musty Tech, Technician Referring MD:              Medicines:                See the Anesthesia note for documentation of the                            administered medications Complications:            No immediate complications. Estimated Blood Loss:     Estimated blood loss: none. Procedure:                Pre-Anesthesia Assessment:                           - The anesthesia plan was to use monitored                            anesthesia care (MAC).                           After obtaining informed consent, the colonoscope                            was passed under direct vision. Throughout the                            procedure, the patient's blood pressure, pulse, and                            oxygen saturations were monitored continuously. The                            [id number]-HQ190L ([id number]) scope was introduced through                            the anus and advanced to the the cecum, identified                            by appendiceal orifice and ileocecal valve. The                            colonoscopy was performed without difficulty. The  patient tolerated the procedure well. The quality                            of the bowel preparation was evaluated using the                            BBPS [facility] Bowel Preparation Scale) with scores                            of: Right Colon = 3, Transverse Colon = 3 and Left                            Colon = 3 (entire  mucosa seen well with no residual                            staining, small fragments of stool or opaque                            liquid). The total BBPS score equals 9. Scope In: 8:43:05 AM Scope Out: 8:56:29 AM Scope Withdrawal Time: 0 hours 9 minutes 49 seconds  Total Procedure Duration: 0 hours 13 minutes 24 seconds  Findings:      The perianal and digital rectal examinations were normal.      Non-bleeding internal hemorrhoids were found during endoscopy.      The exam was otherwise without abnormality. Impression:               - Non-bleeding internal hemorrhoids.                           - The examination was otherwise normal.                           - No specimens collected. Moderate Sedation:      Per Anesthesia Care Recommendation:           - Patient has a contact number available for                            emergencies. The signs and symptoms of potential                            delayed complications were discussed with the                            patient. Return to normal activities tomorrow.                            Written discharge instructions were provided to the                            patient.                           - Resume previous diet.                           -  Continue present medications.                           - Repeat colonoscopy in 10 years for screening                            purposes.                           - Return to GI clinic in 4 weeks. Procedure Code(s):        --- Professional ---                           B7628, Colorectal cancer screening; colonoscopy on                            individual not meeting criteria for high risk Diagnosis Code(s):        --- Professional ---                           Z12.11, Encounter for screening for malignant                            neoplasm of colon                           K64.8, Other hemorrhoids CPT copyright 2022 [employer]. All rights reserved. The  codes documented in this report are preliminary and upon coder review may  be revised to meet current compliance requirements. Elon Alas. Abbey Chatters, DO Chalfant Alyssha Housh, DO 12/13/2022 9:02:12 AM This report has been signed electronically. Number of Addenda: 0

## 2022-12-13 NOTE — Discharge Instructions (Addendum)
  Colonoscopy Discharge Instructions  Read the instructions outlined below and refer to this sheet in the next few weeks. These discharge instructions provide you with general information on caring for yourself after you leave the hospital. Your doctor may also give you specific instructions. While your treatment has been planned according to the most current medical practices available, unavoidable complications occasionally occur.   ACTIVITY You may resume your regular activity, but move at a slower pace for the next 24 hours.  Take frequent rest periods for the next 24 hours.  Walking will help get rid of the air and reduce the bloated feeling in your belly (abdomen).  No driving for 24 hours (because of the medicine (anesthesia) used during the test).   Do not sign any important legal documents or operate any machinery for 24 hours (because of the anesthesia used during the test).  NUTRITION Drink plenty of fluids.  You may resume your normal diet as instructed by your doctor.  Begin with a light meal and progress to your normal diet. Heavy or fried foods are harder to digest and may make you feel sick to your stomach (nauseated).  Avoid alcoholic beverages for 24 hours or as instructed.  MEDICATIONS You may resume your normal medications unless your doctor tells you otherwise.  WHAT YOU CAN EXPECT TODAY Some feelings of bloating in the abdomen.  Passage of more gas than usual.  Spotting of blood in your stool or on the toilet paper.  IF YOU HAD POLYPS REMOVED DURING THE COLONOSCOPY: No aspirin products for 7 days or as instructed.  No alcohol for 7 days or as instructed.  Eat a soft diet for the next 24 hours.  FINDING OUT THE RESULTS OF YOUR TEST Not all test results are available during your visit. If your test results are not back during the visit, make an appointment with your caregiver to find out the results. Do not assume everything is normal if you have not heard from your  caregiver or the medical facility. It is important for you to follow up on all of your test results.  SEEK IMMEDIATE MEDICAL ATTENTION IF: You have more than a spotting of blood in your stool.  Your belly is swollen (abdominal distention).  You are nauseated or vomiting.  You have a temperature over 101.  You have abdominal pain or discomfort that is severe or gets worse throughout the day.   Your colonoscopy was relatively unremarkable.  I did not find any polyps or evidence of colon cancer.  I recommend repeating colonoscopy in 10 years for colon cancer screening purposes.  You do have internal hemorrhoids. I would recommend increasing fiber in your diet or adding OTC Benefiber/Metamucil. Be sure to drink at least 4 to 6 glasses of water daily. Follow-up with GI in 3-4 months   I hope you have a great rest of your week!  Elon Alas. Abbey Chatters, D.O. Gastroenterology and Hepatology [facility]

## 2022-12-13 NOTE — Anesthesia Postprocedure Evaluation (Signed)
Anesthesia Post Note  Patient: Madison Griffin  Procedure(s) Performed: COLONOSCOPY WITH PROPOFOL  Patient location during evaluation: Phase II Anesthesia Type: General Level of consciousness: awake and alert and oriented Pain management: pain level controlled Vital Signs Assessment: post-procedure vital signs reviewed and stable Respiratory status: spontaneous breathing, nonlabored ventilation and respiratory function stable Cardiovascular status: blood pressure returned to baseline and stable Postop Assessment: no apparent nausea or vomiting Anesthetic complications: no  No notable events documented.   Last Vitals:  Vitals:   12/13/22 0859 12/13/22 0906  BP: (!) 86/45 93/60  Pulse: 60 60  Resp: 20 20  Temp: (!) 36.4 C   SpO2: 100% 100%    Last Pain:  Vitals:   12/13/22 0859  TempSrc: Axillary  PainSc: 0-No pain                 Messiyah Waterson C Konnor Jorden

## 2022-12-13 NOTE — Anesthesia Preprocedure Evaluation (Addendum)
Anesthesia Evaluation  Patient identified by MRN, date of birth, ID band Patient awake    Reviewed: Allergy & Precautions, H&P , NPO status , Patient's Chart, lab work & pertinent test results  Airway Mallampati: II  TM Distance: >3 FB Neck ROM: Full    Dental  (+) Dental Advisory Given, Upper Dentures   Pulmonary neg pulmonary ROS   Pulmonary exam normal breath sounds clear to auscultation       Cardiovascular negative cardio ROS Normal cardiovascular exam Rhythm:Regular Rate:Normal     Neuro/Psych negative neurological ROS  negative psych ROS   GI/Hepatic negative GI ROS, Neg liver ROS,,,  Endo/Other  Hypothyroidism    Renal/GU negative Renal ROS  negative genitourinary   Musculoskeletal negative musculoskeletal ROS (+)    Abdominal   Peds negative pediatric ROS (+)  Hematology negative hematology ROS (+)   Anesthesia Other Findings   Reproductive/Obstetrics negative OB ROS                             Anesthesia Physical Anesthesia Plan  ASA: 2  Anesthesia Plan: General   Post-op Pain Management: Minimal or no pain anticipated   Induction: Intravenous  PONV Risk Score and Plan: Propofol infusion  Airway Management Planned: Nasal Cannula and Natural Airway  Additional Equipment:   Intra-op Plan:   Post-operative Plan:   Informed Consent: I have reviewed the patients History and Physical, chart, labs and discussed the procedure including the risks, benefits and alternatives for the proposed anesthesia with the patient or authorized representative who has indicated his/her understanding and acceptance.     Dental advisory given  Plan Discussed with: CRNA and Surgeon  Anesthesia Plan Comments:        Anesthesia Quick Evaluation

## 2022-12-13 NOTE — Interval H&P Note (Signed)
History and Physical Interval Note:  12/13/2022 8:34 AM  Madison Griffin  has presented today for surgery, with the diagnosis of positive cologuard,constipation,GERD.  The various methods of treatment have been discussed with the patient and family. After consideration of risks, benefits and other options for treatment, the patient has consented to  Procedure(s) with comments: COLONOSCOPY WITH PROPOFOL (N/A) - 1:15 pm as a surgical intervention.  The patient's history has been reviewed, patient examined, no change in status, stable for surgery.  I have reviewed the patient's chart and labs.  Questions were answered to the patient's satisfaction.     Eloise Harman

## 2022-12-18 ENCOUNTER — Encounter (HOSPITAL_COMMUNITY): Payer: Self-pay | Admitting: Internal Medicine

## 2023-04-14 ENCOUNTER — Ambulatory Visit: Payer: 59 | Admitting: Gastroenterology
